# Patient Record
Sex: Female | Born: 1990
Health system: Southern US, Community
[De-identification: ages and names within clinical notes are randomized; demographics above are authoritative.]

---

## 2021-03-13 ENCOUNTER — Encounter (HOSPITAL_COMMUNITY): Payer: Self-pay | Admitting: Emergency Medicine

## 2021-03-13 ENCOUNTER — Emergency Department (HOSPITAL_COMMUNITY): Payer: Self-pay

## 2021-03-13 ENCOUNTER — Other Ambulatory Visit: Payer: Self-pay

## 2021-03-13 ENCOUNTER — Inpatient Hospital Stay (HOSPITAL_COMMUNITY)
Admission: EM | Admit: 2021-03-13 | Discharge: 2021-03-21 | DRG: 872 | Disposition: A | Discharge status: Home / Self Care | Payer: Self-pay | Attending: Internal Medicine | Admitting: Internal Medicine

## 2021-03-13 ENCOUNTER — Inpatient Hospital Stay (HOSPITAL_COMMUNITY): Payer: Self-pay

## 2021-03-13 DIAGNOSIS — R109 Unspecified abdominal pain: Secondary | ICD-10-CM

## 2021-03-13 DIAGNOSIS — F4310 Post-traumatic stress disorder, unspecified: Secondary | ICD-10-CM

## 2021-03-13 DIAGNOSIS — Z9151 Personal history of suicidal behavior: Secondary | ICD-10-CM

## 2021-03-13 DIAGNOSIS — Z62810 Personal history of physical and sexual abuse in childhood: Secondary | ICD-10-CM | POA: Diagnosis present

## 2021-03-13 DIAGNOSIS — E876 Hypokalemia: Secondary | ICD-10-CM | POA: Diagnosis present

## 2021-03-13 DIAGNOSIS — F901 Attention-deficit hyperactivity disorder, predominantly hyperactive type: Secondary | ICD-10-CM

## 2021-03-13 DIAGNOSIS — B1710 Acute hepatitis C without hepatic coma: Secondary | ICD-10-CM | POA: Diagnosis present

## 2021-03-13 DIAGNOSIS — F319 Bipolar disorder, unspecified: Secondary | ICD-10-CM

## 2021-03-13 DIAGNOSIS — E861 Hypovolemia: Secondary | ICD-10-CM | POA: Diagnosis present

## 2021-03-13 DIAGNOSIS — A419 Sepsis, unspecified organism: Principal | ICD-10-CM | POA: Diagnosis present

## 2021-03-13 DIAGNOSIS — Z79899 Other long term (current) drug therapy: Secondary | ICD-10-CM

## 2021-03-13 DIAGNOSIS — R042 Hemoptysis: Secondary | ICD-10-CM | POA: Diagnosis not present

## 2021-03-13 DIAGNOSIS — F4312 Post-traumatic stress disorder, chronic: Secondary | ICD-10-CM | POA: Diagnosis present

## 2021-03-13 DIAGNOSIS — A400 Sepsis due to streptococcus, group A: Principal | ICD-10-CM | POA: Diagnosis present

## 2021-03-13 DIAGNOSIS — Z818 Family history of other mental and behavioral disorders: Secondary | ICD-10-CM

## 2021-03-13 DIAGNOSIS — E871 Hypo-osmolality and hyponatremia: Secondary | ICD-10-CM | POA: Diagnosis present

## 2021-03-13 DIAGNOSIS — Z20822 Contact with and (suspected) exposure to covid-19: Secondary | ICD-10-CM | POA: Diagnosis present

## 2021-03-13 DIAGNOSIS — N179 Acute kidney failure, unspecified: Secondary | ICD-10-CM | POA: Diagnosis present

## 2021-03-13 DIAGNOSIS — Z7251 High risk heterosexual behavior: Secondary | ICD-10-CM

## 2021-03-13 DIAGNOSIS — J189 Pneumonia, unspecified organism: Secondary | ICD-10-CM

## 2021-03-13 DIAGNOSIS — R9389 Abnormal findings on diagnostic imaging of other specified body structures: Secondary | ICD-10-CM

## 2021-03-13 DIAGNOSIS — D509 Iron deficiency anemia, unspecified: Secondary | ICD-10-CM | POA: Diagnosis present

## 2021-03-13 LAB — URINALYSIS, ROUTINE W REFLEX MICROSCOPIC
Bacteria, UA: NONE SEEN
Bilirubin Urine: NEGATIVE
Bilirubin Urine: NEGATIVE
Glucose, UA: NEGATIVE mg/dL
Glucose, UA: NEGATIVE mg/dL
Ketones, ur: 5 mg/dL — AB
Ketones, ur: 20 mg/dL — AB
Leukocytes,Ua: NEGATIVE
Leukocytes,Ua: NEGATIVE
Nitrite: NEGATIVE
Nitrite: NEGATIVE
Protein, ur: NEGATIVE mg/dL
Protein, ur: 30 mg/dL — AB
Specific Gravity, Urine: 1.006 (ref 1.005–1.030)
Specific Gravity, Urine: 1.028 (ref 1.005–1.030)
pH: 6 (ref 5.0–8.0)
pH: 6 (ref 5.0–8.0)

## 2021-03-13 LAB — CBC
HCT: 30.5 % — ABNORMAL LOW (ref 36.0–46.0)
Hemoglobin: 10.4 g/dL — ABNORMAL LOW (ref 12.0–15.0)
MCH: 26.1 pg (ref 26.0–34.0)
MCHC: 34.1 g/dL (ref 30.0–36.0)
MCV: 76.6 fL — ABNORMAL LOW (ref 80.0–100.0)
Platelets: 153 10*3/uL (ref 150–400)
RBC: 3.98 MIL/uL (ref 3.87–5.11)
RDW: 15.7 % — ABNORMAL HIGH (ref 11.5–15.5)
WBC: 15.6 10*3/uL — ABNORMAL HIGH (ref 4.0–10.5)
nRBC: 0 % (ref 0.0–0.2)

## 2021-03-13 LAB — CBC WITH DIFFERENTIAL/PLATELET
Abs Immature Granulocytes: 0.2 10*3/uL — ABNORMAL HIGH (ref 0.00–0.07)
Basophils Absolute: 0 10*3/uL (ref 0.0–0.1)
Basophils Relative: 0 %
Eosinophils Absolute: 0 10*3/uL (ref 0.0–0.5)
Eosinophils Relative: 0 %
HCT: 30.9 % — ABNORMAL LOW (ref 36.0–46.0)
Hemoglobin: 10.2 g/dL — ABNORMAL LOW (ref 12.0–15.0)
Lymphocytes Relative: 2 %
Lymphs Abs: 0.3 10*3/uL — ABNORMAL LOW (ref 0.7–4.0)
MCH: 25.4 pg — ABNORMAL LOW (ref 26.0–34.0)
MCHC: 33 g/dL (ref 30.0–36.0)
MCV: 77.1 fL — ABNORMAL LOW (ref 80.0–100.0)
Metamyelocytes Relative: 1 %
Monocytes Absolute: 0.6 10*3/uL (ref 0.1–1.0)
Monocytes Relative: 4 %
Neutro Abs: 14.4 10*3/uL — ABNORMAL HIGH (ref 1.7–7.7)
Neutrophils Relative %: 93 %
Platelets: 147 10*3/uL — ABNORMAL LOW (ref 150–400)
RBC: 4.01 MIL/uL (ref 3.87–5.11)
RDW: 15.7 % — ABNORMAL HIGH (ref 11.5–15.5)
WBC: 15.5 10*3/uL — ABNORMAL HIGH (ref 4.0–10.5)
nRBC: 0 % (ref 0.0–0.2)
nRBC: 0 /100 WBC

## 2021-03-13 LAB — PROTIME-INR
INR: 1.3 — ABNORMAL HIGH (ref 0.8–1.2)
Prothrombin Time: 15.5 seconds — ABNORMAL HIGH (ref 11.4–15.2)

## 2021-03-13 LAB — COMPREHENSIVE METABOLIC PANEL
ALT: 56 U/L — ABNORMAL HIGH (ref 0–44)
AST: 72 U/L — ABNORMAL HIGH (ref 15–41)
Albumin: 2.8 g/dL — ABNORMAL LOW (ref 3.5–5.0)
Alkaline Phosphatase: 57 U/L (ref 38–126)
Anion gap: 13 (ref 5–15)
BUN: 30 mg/dL — ABNORMAL HIGH (ref 6–20)
CO2: 20 mmol/L — ABNORMAL LOW (ref 22–32)
Calcium: 8.2 mg/dL — ABNORMAL LOW (ref 8.9–10.3)
Chloride: 90 mmol/L — ABNORMAL LOW (ref 98–111)
Creatinine, Ser: 1.61 mg/dL — ABNORMAL HIGH (ref 0.44–1.00)
GFR, Estimated: 44 mL/min — ABNORMAL LOW (ref >60)
Glucose, Bld: 116 mg/dL — ABNORMAL HIGH (ref 70–99)
Potassium: 2.2 mmol/L — CL (ref 3.5–5.1)
Sodium: 123 mmol/L — ABNORMAL LOW (ref 135–145)
Total Bilirubin: 1.4 mg/dL — ABNORMAL HIGH (ref 0.3–1.2)
Total Protein: 6.7 g/dL (ref 6.5–8.1)

## 2021-03-13 LAB — RESP PANEL BY RT-PCR (FLU A&B, COVID) ARPGX2
Influenza A by PCR: NEGATIVE
Influenza B by PCR: NEGATIVE
SARS Coronavirus 2 by RT PCR: NEGATIVE

## 2021-03-13 LAB — BASIC METABOLIC PANEL
Anion gap: 12 (ref 5–15)
BUN: 23 mg/dL — ABNORMAL HIGH (ref 6–20)
CO2: 16 mmol/L — ABNORMAL LOW (ref 22–32)
Calcium: 8 mg/dL — ABNORMAL LOW (ref 8.9–10.3)
Chloride: 101 mmol/L (ref 98–111)
Creatinine, Ser: 1.26 mg/dL — ABNORMAL HIGH (ref 0.44–1.00)
GFR, Estimated: 59 mL/min — ABNORMAL LOW (ref >60)
Glucose, Bld: 80 mg/dL (ref 70–99)
Potassium: 2.7 mmol/L — CL (ref 3.5–5.1)
Sodium: 129 mmol/L — ABNORMAL LOW (ref 135–145)

## 2021-03-13 LAB — MAGNESIUM: Magnesium: 2.3 mg/dL (ref 1.7–2.4)

## 2021-03-13 LAB — APTT: aPTT: 40 seconds — ABNORMAL HIGH (ref 24–36)

## 2021-03-13 LAB — LACTIC ACID, PLASMA
Lactic Acid, Venous: 2.3 mmol/L (ref 0.5–1.9)
Lactic Acid, Venous: 2.2 mmol/L (ref 0.5–1.9)
Lactic Acid, Venous: 2.1 mmol/L (ref 0.5–1.9)

## 2021-03-13 LAB — CK: Total CK: 83 U/L (ref 38–234)

## 2021-03-13 LAB — I-STAT BETA HCG BLOOD, ED (MC, WL, AP ONLY): I-stat hCG, quantitative: 24 m[IU]/mL — ABNORMAL HIGH (ref <5)

## 2021-03-13 LAB — LIPASE, BLOOD: Lipase: 23 U/L (ref 11–51)

## 2021-03-13 LAB — HCG, QUANTITATIVE, PREGNANCY: hCG, Beta Chain, Quant, S: 1 m[IU]/mL (ref <5)

## 2021-03-13 LAB — HIV ANTIBODY (ROUTINE TESTING W REFLEX): HIV Screen 4th Generation wRfx: NONREACTIVE

## 2021-03-13 LAB — TSH: TSH: 0.756 u[IU]/mL (ref 0.350–4.500)

## 2021-03-13 MED ORDER — SODIUM CHLORIDE 0.9 % IV SOLN
2.0000 g | INTRAVENOUS | Status: DC
  Administered 2021-03-14: 2 g via INTRAVENOUS
  Filled 2021-03-13: qty 2

## 2021-03-13 MED ORDER — ACETAMINOPHEN 325 MG PO TABS
650.0000 mg | ORAL_TABLET | Freq: Once | ORAL | Status: AC | PRN
  Administered 2021-03-13: 650 mg via ORAL
  Filled 2021-03-13: qty 2

## 2021-03-13 MED ORDER — MELATONIN 3 MG PO TABS
3.0000 mg | ORAL_TABLET | Freq: Every evening | ORAL | Status: DC | PRN
  Administered 2021-03-13 – 2021-03-16 (×3): 3 mg via ORAL
  Filled 2021-03-13 (×3): qty 1

## 2021-03-13 MED ORDER — LACTATED RINGERS IV SOLN: INTRAVENOUS | Status: DC

## 2021-03-13 MED ORDER — POTASSIUM CHLORIDE 10 MEQ/100ML IV SOLN
10.0000 meq | INTRAVENOUS | Status: AC
  Administered 2021-03-13 (×4): 10 meq via INTRAVENOUS
  Filled 2021-03-13 (×3): qty 100

## 2021-03-13 MED ORDER — POTASSIUM CHLORIDE CRYS ER 20 MEQ PO TBCR
40.0000 meq | EXTENDED_RELEASE_TABLET | Freq: Once | ORAL | Status: AC
  Administered 2021-03-13: 40 meq via ORAL
  Filled 2021-03-13: qty 2

## 2021-03-13 MED ORDER — ACETAMINOPHEN 325 MG PO TABS
650.0000 mg | ORAL_TABLET | Freq: Four times a day (QID) | ORAL | Status: DC | PRN
  Administered 2021-03-13 – 2021-03-14 (×2): 650 mg via ORAL
  Filled 2021-03-13 (×2): qty 2

## 2021-03-13 MED ORDER — ONDANSETRON HCL 4 MG/2ML IJ SOLN
4.0000 mg | Freq: Once | INTRAMUSCULAR | Status: AC
  Administered 2021-03-13: 4 mg via INTRAVENOUS
  Filled 2021-03-13: qty 2

## 2021-03-13 MED ORDER — SODIUM CHLORIDE 0.9 % IV BOLUS
1000.0000 mL | Freq: Once | INTRAVENOUS | Status: AC
  Administered 2021-03-13: 1000 mL via INTRAVENOUS

## 2021-03-13 MED ORDER — SODIUM CHLORIDE 0.9 % IV SOLN
2.0000 g | Freq: Once | INTRAVENOUS | Status: AC
  Administered 2021-03-13: 2 g via INTRAVENOUS
  Filled 2021-03-13: qty 20

## 2021-03-13 MED ORDER — IOHEXOL 300 MG/ML  SOLN
100.0000 mL | Freq: Once | INTRAMUSCULAR | Status: AC | PRN
  Administered 2021-03-13: 100 mL via INTRAVENOUS

## 2021-03-13 MED ORDER — ENOXAPARIN SODIUM 40 MG/0.4ML ~~LOC~~ SOLN
40.0000 mg | SUBCUTANEOUS | Status: DC
  Administered 2021-03-15 – 2021-03-20 (×4): 40 mg via SUBCUTANEOUS
  Filled 2021-03-13 (×8): qty 0.4

## 2021-03-13 MED ORDER — MELATONIN 3 MG PO TABS: 3.0000 mg | ORAL_TABLET | Freq: Every day | ORAL | Status: DC

## 2021-03-13 MED ORDER — HYDROMORPHONE HCL 1 MG/ML IJ SOLN
0.5000 mg | INTRAMUSCULAR | Status: DC | PRN
  Administered 2021-03-13: 0.5 mg via INTRAVENOUS
  Filled 2021-03-13: qty 1

## 2021-03-13 MED ORDER — ENSURE ENLIVE PO LIQD
237.0000 mL | Freq: Two times a day (BID) | ORAL | Status: DC
  Administered 2021-03-14 (×2): 237 mL via ORAL

## 2021-03-13 MED ORDER — METRONIDAZOLE IN NACL 5-0.79 MG/ML-% IV SOLN
500.0000 mg | Freq: Three times a day (TID) | INTRAVENOUS | Status: DC
  Administered 2021-03-13 – 2021-03-14 (×2): 500 mg via INTRAVENOUS
  Filled 2021-03-13 (×2): qty 100

## 2021-03-13 MED ORDER — POTASSIUM CHLORIDE 10 MEQ/100ML IV SOLN
10.0000 meq | INTRAVENOUS | Status: AC
  Administered 2021-03-13 – 2021-03-14 (×6): 10 meq via INTRAVENOUS
  Filled 2021-03-13 (×7): qty 100

## 2021-03-13 MED ORDER — METRONIDAZOLE IN NACL 5-0.79 MG/ML-% IV SOLN
500.0000 mg | Freq: Once | INTRAVENOUS | Status: AC
  Administered 2021-03-13: 500 mg via INTRAVENOUS
  Filled 2021-03-13: qty 100

## 2021-03-13 NOTE — H&P (Signed)
Date: 03/13/2021               Patient Name:  Amy Murray MRN: [id number]  DOB: 22-Jul-1991 Age / Sex: 30 y.o., female   PCP: Patient, No Pcp Per              Medical Service: Internal Medicine Teaching Service              Attending Physician: Dr. Mayford Knife, Dorene Ar, MD    First Contact: Lyda Kalata, MS 3 Pager: [phone]  Second Contact: Dr. Roylene Reason Pager: [phone]  Third Contact Dr. Albertha Ghee Pager: [phone]       After Hours (After 5p/  First Contact Pager: [phone]  weekends / holidays): Second Contact Pager: [phone]   Chief Complaint: Abdominal Pain  History of Present Illness:  Amy Murray is a 30 y.o. woman with a past medical history of bipolar disorder, PTSD, ADHD who presents with abdominal pain. Patient states that she was very cold Friday night after getting her hair done, smoking marijuana with her friend, and eating [employer]. On Saturday morning, she woke up feeling weak and had diffuse abdominal pain that was worse on her right-side and flank. She also began to experience several episodes of "explosive", non-bloody diarrhea, which has been intermittently brown and yellow in color. Notes associated nausea and bilious vomiting, which has been worse when trying to eat or drink. Patient also reports pain with urination and darkened urine. Abdominal pain worsened Monday and patient was evaluated at urgent care. Given Zofran and IVF for nausea at that time without relief. Presented to ED today after developing a fever and productive cough in setting of worsening abdominal pain, nausea, vomiting, and weakness. Cough is now productive of green sputum with streaks of blood. States that a number of her friends and family members had "stomach viruses" with abdominal pain and diarrhea, but their symptoms were milder and occurred several months ago.   Review of Systems: (-): chest pain, shortness of breath, headache, vision changes, vaginal discharge  Past Medical  History:  Bipolar Disorder ADHD PTSD   Home Meds:  None -- recently moved to [facility] and has not found a provider   Allergies: Allergies as of 03/13/2021  . (No Known Allergies)   Surgical History:  None   Family History:  Mother - diabetes, hypertension, bipolar disorder   Social History:  Moved to [place] 1.5 months ago. Previously lived in [place]. Uses marijuana. Drinks alcohol occasionally. Does not use tobacco.   Sexual History: Patient is sexually active with men and women. Uses condoms for contraception when sexually active with men. Does not use contraception when sexually active with women. Has had gonorrhea in the past when she was 19. No other sexually transmitted infections noted.   Physical Exam: Blood pressure 104/67, pulse (!) 120, temperature 99.8 F (37.7 C), temperature source Oral, resp. rate (!) 36, last menstrual period 03/06/2021, SpO2 97 %.   General: Lying in bed, arms by side, moving very sparingly, appears uncomfortable Head: Normocephalic, atraumatic  Eyes: EOM intact, PERRL, no conjunctival pallor, anicteric    Cardiovascular: Regular rate, rhythm; no murmurs, rubs, or gallops; 2+ pulses, all extremities; normal capillary refill    Lungs: Normal pulmonary effort; decreased breath sounds, bilaterally Abdomen: Normal bowel sounds, soft, non-distended; diffuse tenderness to palpation; right flank tenderness; no rebound, guarding   Extremities: No edema, erythema, cyanosis  Skin: Warm, dry  Neuro: Normal attention, mood   Imaging on Admission:   EKG: personally  reviewed my interpretation is sinus tachycardia and prolonged QTc  CXR: personally reviewed my interpretation is pulmonary congestion with no pleural effusion, no lobar pneumonia  DG Chest:  1. Low volume rotated film with pulmonary vascular congestion.  CT Abdomen Pelvis W Contrast:  1. Small foci of gas in the bladder coupled with wall thickening. Haziness in fat between bladder  and uterus. Findings suggest cystitis.  2. Kidneys - normal 3. Small focus of consolidation at the medial right lung base, suggestive of early pneumonia. Aspiration pneumonitis also possible.  4. Appendix - normal.  5. Ovaries appear normal. Uterus grossly normal. Haziness in fat plane between the anterior wall of the uterus and posterior wall of the bladder.   Labs on Admission:  CBC Latest Ref Rng & Units 03/13/2021 03/13/2021  WBC 4.0 - 10.5 K/uL 15.5(H) 15.6(H)  Hemoglobin 12.0 - 15.0 g/dL 10.2(L) 10.4(L)  Hematocrit 36.0 - 46.0 % 30.9(L) 30.5(L)  Platelets 150 - 400 K/uL 147(L) 153  MCV: 77.1 RDW: 15.7 Neutrophils: 93% Neutrophils, absolute: 14.4 Absolute Immature Granulocytes: 0.20  Lymphocytes: 2%  Lymphocytes, absolute: 0.3  CMP Latest Ref Rng & Units 03/13/2021 03/13/2021  Glucose 70 - 99 mg/dL 80 762(G)  BUN 6 - 20 mg/dL 31(D) 17(O)  Creatinine 0.44 - 1.00 mg/dL 1.60(V) 3.71(G)  Sodium 135 - 145 mmol/L 129(L) 123(L)  Potassium 3.5 - 5.1 mmol/L 2.7(LL) 2.2(LL)  Chloride 98 - 111 mmol/L 101 90(L)  CO2 22 - 32 mmol/L 16(L) 20(L)  Calcium 8.9 - 10.3 mg/dL 8.0(L) 8.2(L)  Total Protein 6.5 - 8.1 g/dL - 6.7  Total Bilirubin 0.3 - 1.2 mg/dL - 6.2(I)  Alkaline Phos 38 - 126 U/L - 57  AST 15 - 41 U/L - 72(H)  ALT 0 - 44 U/L - 56(H)  Mg - 2.3  Urinalysis:  Hgb urine dipstick- large  Ketones- 5  Nitrite- negative  Leukocytes- negative  RBC/HPF- 6-10 Bacteria- rare   hCG - 1.0   Lactic acid, plasma - 2.2   Prothrombin time - 15.5  INR - 1.3  APTT - 40  TSH - pending CK - pending    Blood culture  - pending  GC/Chlamydia - pending  Sputum culture - pending  Urine culture - pending  GI panel - pending  C Diff Screen - pending  HIV - pending   Assessment & Plan by Problem: Active Problems:   Sepsis (HCC)  In summary, Amy Murray is a 30 y.o. woman who presents for evaluation of generalized abdominal pain and admitted for sepsis with unknown source.     #Sepsis  #Abdominal Pain, N/V/D ,cough, dysuria Patient presents with signs concerning of a bacterial infection (fever, elevated WBC w/ increased absolute neutrophils) complicated by sepsis (elevated lactic acid, tachycardia, tachypnea). Multiple etiologies are possible given patient is having multiple symptoms, infection work-up pending. Patient presents with signs abdominal infection (diarrhea, diffuse abdominal tenderness, nausea, vomiting). Signs of bacterial pneumonia are also present (productive, rust-colored cough; CT demonstrating consolidation).  It is possible patient had an aspiration event given she has been vomiting for multiple days.  Signs of UTI present (dysuria, microhematuria, CT demonstrating bladder wall thickening) but negative for leukocytes and nitrites.  PID also possible in setting of CT demonstrating haziness at uterus-bladder border and sexual activity without contraception. Normal HCG rules out ectopic pregnancy. No signs of pyelonephritis. No signs of ovarian abscess on imaging.  Patient has been started on broad-spectrum antibiotic coverage with ceftriaxone and metronidazole for anaerobic coverage.  We will  follow up culture data and adjust antibiotic regimen.   Plan:  - Treat empirically for GI infection (metronidazole IV 500 mg Q8H) - Treat empirically for pneumonia (ceftriaxone IV 2g daily)  - F/u GI panel, sputum culture, urine culture, blood culture, GC/Chlamydia test,  - F/u repeat plasma lactic acid  - IVF LR 200 cc/hr   - F/u CBC tomorrow  - IV Zofran 4 mg PRN for nausea and vomiting  #Hypokalemia  On admission, serum potassium was decreased at 2.2. Improved to 2.7. Likely secondary to multiple episodes of vomiting and diarrhea. This is possibly contributing to patients diffuse weakness.  Plan: - Replete with IV KCl 10 mEq for 6 doses   - F/u CMP to monitor electrolytes   #Hypovolemic Hyponatremia  Asymptomatic mild hypovolemic hyponatremia which is  improving with fluids.  Not on any medications which would cause this.  This is in setting of several episodes of vomiting, diarrhea. On admission, serum sodium was 123. Partially repleted to 129 with IVF NS in ED.  Plan:  - Replete with IVF at a rate of 4-6 mEq/hr to avoid osmotic demyelination syndrome  - F/u CMP to monitor electrolytes  #AKI? Patient comes in with a creatinine of 1.61.  We do not have labs prior to suggest that this is a acute kidney injury or CKD stage III.  I expect a prerenal AKI given presenting symptoms and BUN to creatinine ratio approximately 20.  - Trend Renal function with fluids  #Microcytic anemia Patient's hemoglobin 10.2, no active bleeding.  Her last menstrual cycle was a few weeks ago. -holding off on checking iron studies during acute infection as we would not treat IDA -Trend hemoglobin on CBC.  #Elevated i-STAT hCG -Followed up with serum hCG which was less than 1, not pregnant  Diet:Regular VTE ppx: Enoxaparin  Code status:Full  Dispo: Admit patient to Inpatient with expected length of stay greater than 2 midnights.  Signed: Lyda Kalata, Medical Student 03/13/2021, 4:30 PM   Attestation for Student Documentation:  I personally was present and performed or re-performed the history, physical exam and medical decision-making activities of this service and have verified that the service and findings are accurately documented in the student's note.  Albertha Ghee, MD 03/13/2021, 6:47 PM

## 2021-03-13 NOTE — ED Notes (Signed)
Got patient into a gown on the monitor patient is resting with call bell in reach 

## 2021-03-13 NOTE — ED Notes (Signed)
IV access attempted without success.

## 2021-03-13 NOTE — ED Provider Notes (Signed)
[facility] EMERGENCY DEPARTMENT Provider Note   CSN: [id number] Arrival date & time: 03/13/21  4128     History Chief Complaint  Patient presents with  . Abdominal Pain    Amy Murray is a 30 y.o. female.  30 year old female with no significant past medical history presents with complaint of nausea, vomiting, diarrhea, abdominal pain and fever.  Patient states her symptoms started about 4 days ago, thought to be related to eating contaminated food (pizza), went to urgent care yesterday and was given IV fluids and Zofran.  Patient started running a fever yesterday and arrives emergency room with a temp of 102.9.  Last took Tylenol last night.  No known sick contacts, describes emesis and stools as nonbloody, not greasy, not bilious.  No prior abdominal surgeries.  Pain is located generalized abdomen, described as "a beat box."  Also reports generalized body aches and joint pains which she relates to sitting on her knees and vomiting frequently.  Also reports dysuria, states she was told in urgent care that she had blood in her urine however she has not seen blood in the urine.        History reviewed. No pertinent past medical history.  Patient Active Problem List   Diagnosis Date Noted  . Sepsis (HCC) 03/13/2021    History reviewed. No pertinent surgical history.   OB History   No obstetric history on file.     No family history on file.  Social History   Tobacco Use  . Smoking status: Never Smoker  . Smokeless tobacco: Never Used  Substance Use Topics  . Alcohol use: Not Currently  . Drug use: Not Currently    Home Medications Prior to Admission medications   Not on File    Allergies    Patient has no known allergies.  Review of Systems   Review of Systems  Constitutional: Positive for chills and fever.  HENT: Negative for congestion.   Respiratory: Negative for shortness of breath.   Cardiovascular: Negative for chest pain.   Gastrointestinal: Positive for abdominal pain, diarrhea, nausea and vomiting. Negative for blood in stool and constipation.  Genitourinary: Positive for dysuria. Negative for hematuria.  Musculoskeletal: Positive for arthralgias and myalgias. Negative for back pain.  Skin: Negative for rash and wound.  Allergic/Immunologic: Negative for immunocompromised state.  Neurological: Negative for weakness.  Hematological: Negative for adenopathy.  Psychiatric/Behavioral: Negative for confusion.  All other systems reviewed and are negative.   Physical Exam Updated Vital Signs BP 126/75   Pulse (!) 126   Temp 99.8 F (37.7 C) (Oral)   Resp (!) 36   LMP 03/06/2021   SpO2 97%   Physical Exam Vitals and nursing note reviewed.  Constitutional:      General: She is not in acute distress.    Appearance: She is well-developed. She is not diaphoretic.  HENT:     Head: Normocephalic and atraumatic.  Cardiovascular:     Rate and Rhythm: Regular rhythm. Tachycardia present.     Heart sounds: Normal heart sounds.  Pulmonary:     Effort: Pulmonary effort is normal.     Breath sounds: Normal breath sounds.  Abdominal:     Palpations: Abdomen is soft.     Tenderness: There is generalized abdominal tenderness. There is no right CVA tenderness, left CVA tenderness, guarding or rebound.  Skin:    General: Skin is warm and dry.     Findings: No erythema or rash.  Neurological:  Mental Status: She is alert and oriented to person, place, and time.  Psychiatric:        Behavior: Behavior normal.     ED Results / Procedures / Treatments   Labs (all labs ordered are listed, but only abnormal results are displayed) Labs Reviewed  COMPREHENSIVE METABOLIC PANEL - Abnormal; Notable for the following components:      Result Value   Sodium 123 (*)    Potassium 2.2 (*)    Chloride 90 (*)    CO2 20 (*)    Glucose, Bld 116 (*)    BUN 30 (*)    Creatinine, Ser 1.61 (*)    Calcium 8.2 (*)     Albumin 2.8 (*)    AST 72 (*)    ALT 56 (*)    Total Bilirubin 1.4 (*)    GFR, Estimated 44 (*)    All other components within normal limits  CBC - Abnormal; Notable for the following components:   WBC 15.6 (*)    Hemoglobin 10.4 (*)    HCT 30.5 (*)    MCV 76.6 (*)    RDW 15.7 (*)    All other components within normal limits  LACTIC ACID, PLASMA - Abnormal; Notable for the following components:   Lactic Acid, Venous 2.3 (*)    All other components within normal limits  LACTIC ACID, PLASMA - Abnormal; Notable for the following components:   Lactic Acid, Venous 2.2 (*)    All other components within normal limits  CBC WITH DIFFERENTIAL/PLATELET - Abnormal; Notable for the following components:   WBC 15.5 (*)    Hemoglobin 10.2 (*)    HCT 30.9 (*)    MCV 77.1 (*)    MCH 25.4 (*)    RDW 15.7 (*)    Platelets 147 (*)    Neutro Abs 14.4 (*)    Lymphs Abs 0.3 (*)    Abs Immature Granulocytes 0.20 (*)    All other components within normal limits  URINALYSIS, ROUTINE W REFLEX MICROSCOPIC - Abnormal; Notable for the following components:   Color, Urine STRAW (*)    Hgb urine dipstick LARGE (*)    Ketones, ur 5 (*)    Bacteria, UA RARE (*)    All other components within normal limits  I-STAT BETA HCG BLOOD, ED ([facility], [facility], [facility] ONLY) - Abnormal; Notable for the following components:   I-stat hCG, quantitative 24.0 (*)    All other components within normal limits  CULTURE, BLOOD (ROUTINE X 2)  CULTURE, BLOOD (ROUTINE X 2)  CULTURE, BLOOD (SINGLE)  URINE CULTURE  GASTROINTESTINAL PANEL BY PCR, STOOL (REPLACES STOOL CULTURE)  C DIFFICILE QUICK SCREEN W PCR REFLEX  RESP PANEL BY RT-PCR (FLU A&B, COVID) ARPGX2  EXPECTORATED SPUTUM ASSESSMENT W GRAM STAIN, RFLX TO RESP C  LIPASE, BLOOD  PROTIME-INR  APTT  MAGNESIUM  HIV ANTIBODY (ROUTINE TESTING W REFLEX)  TSH  BASIC METABOLIC PANEL  HCG, QUANTITATIVE, PREGNANCY  LACTIC ACID, PLASMA  LACTIC ACID, PLASMA  CYTOLOGY - NON PAP     EKG EKG Interpretation  Date/Time:  Tuesday March 13 2021 09:54:03 EDT Ventricular Rate:  135 PR Interval:    QRS Duration: 102 QT Interval:  410 QTC Calculation: 615 R Axis:   74 Text Interpretation: Sinus tachycardia Prolonged QT interval No old tracing to compare Confirmed by Lorre Nick ([id number]) on 03/13/2021 1:04:56 PM   Radiology DG Chest 1 View  Result Date: 03/13/2021 CLINICAL DATA:  Sepsis EXAM: CHEST  1 VIEW COMPARISON:  None. FINDINGS: 1309 hours. Low volume rotated film. Cardiopericardial silhouette is at upper limits of normal for size. There is pulmonary vascular congestion without overt pulmonary edema. No pleural effusion. The visualized bony structures of the thorax show no acute abnormality. Telemetry leads overlie the chest. IMPRESSION: Low volume rotated film with pulmonary vascular congestion. Electronically Signed   By: Kennith CenterEric  Mansell M.D.   On: 03/13/2021 13:35   CT Abdomen Pelvis W Contrast  Result Date: 03/13/2021 CLINICAL DATA:  Abdominal pain and fever.  Nausea and vomiting. EXAM: CT ABDOMEN AND PELVIS WITH CONTRAST TECHNIQUE: Multidetector CT imaging of the abdomen and pelvis was performed using the standard protocol following bolus administration of intravenous contrast. CONTRAST:  100mL OMNIPAQUE IOHEXOL 300 MG/ML  SOLN COMPARISON:  None. FINDINGS: Lower chest: Small focus of consolidation in the medial RIGHT lung base measures 6 cm by 2.9 cm (image 9/series 4) mild atelectasis at the LEFT lung base. Hepatobiliary: No focal hepatic lesion. No biliary duct dilatation. Common bile duct is normal. Pancreas: Pancreas is normal. No ductal dilatation. No pancreatic inflammation. Spleen: Normal spleen Adrenals/urinary tract: Adrenal glands normal. Kidneys enhance symmetrically. No hydronephrosis. Ureters appear normal. There are 2 foci of gas within the bladder which collect along the non dependent wall the bladder. There is haziness in the retroperitoneal fat between  the bladder and uterus (image 72/3). There is thickening of the bladder wall seen on coronal projection 58/6 measuring 5 mm. Stomach/Bowel: Stomach, small-bowel and cecum normal. Appendix is normal. Ascending, transverse and descending colon normal. Rectosigmoid colon normal. Vascular/Lymphatic: Abdominal aorta is normal caliber. No periportal or retroperitoneal adenopathy. No pelvic adenopathy. Reproductive: Uterus is grossly normal. Again indistinct hazy fat between the uterus and the posterior wall the bladder. Ovaries are grossly normal. Other: Small amount free fluid in the posterior cul-de-sac simple fluid attenuation. No abscess in the abdomen pelvis. Musculoskeletal: No aggressive osseous lesion. IMPRESSION: 1. A small foci of gas within the bladder coupled with bladder wall thickening and haziness in the fat between the bladder and the uterus. Findings suggest cystitis. Recommend correlation with instrumentation of the bladder. 2. kidneys appear normal out evidence obstruction or infection. 3. Small focus of consolidation at the medial RIGHT lung base suggests early pneumonia. Aspiration pneumonitis could have similar pattern. 4. Appendix is normal. 5. Ovaries appear normal. Uterus grossly normal other than haziness in the fat plane between the anterior wall of the uterus and posterior wall of the bladder. Findings conveyed toLAURA MURPHY on 03/13/2021  at13:18. Electronically Signed   By: Genevive BiStewart  Edmunds M.D.   On: 03/13/2021 13:19    Procedures .Critical Care Performed by: Jeannie FendMurphy, Laura A, PA-C Authorized by: Jeannie FendMurphy, Laura A, PA-C   Critical care provider statement:    Critical care time (minutes):  45   Critical care was time spent personally by me on the following activities:  Discussions with consultants, evaluation of patient's response to treatment, examination of patient, ordering and performing treatments and interventions, ordering and review of laboratory studies, ordering and review of  radiographic studies, pulse oximetry, re-evaluation of patient's condition, obtaining history from patient or surrogate and review of old charts     Medications Ordered in ED Medications  HYDROmorphone (DILAUDID) injection 0.5 mg (0.5 mg Intravenous Given 03/13/21 1208)  cefTRIAXone (ROCEPHIN) 2 g in sodium chloride 0.9 % 100 mL IVPB (2 g Intravenous New Bag/Given 03/13/21 1457)    And  metroNIDAZOLE (FLAGYL) IVPB 500 mg (500 mg Intravenous New Bag/Given 03/13/21  1457)  potassium chloride 10 mEq in 100 mL IVPB (10 mEq Intravenous New Bag/Given 03/13/21 1503)  enoxaparin (LOVENOX) injection 40 mg (has no administration in time range)  acetaminophen (TYLENOL) tablet 650 mg (650 mg Oral Given 03/13/21 1208)  sodium chloride 0.9 % bolus 1,000 mL (0 mLs Intravenous Stopped 03/13/21 1354)  ondansetron (ZOFRAN) injection 4 mg (4 mg Intravenous Given 03/13/21 1208)  sodium chloride 0.9 % bolus 1,000 mL (0 mLs Intravenous Stopped 03/13/21 1355)  potassium chloride SA (KLOR-CON) CR tablet 40 mEq (40 mEq Oral Given 03/13/21 1255)  iohexol (OMNIPAQUE) 300 MG/ML solution 100 mL (100 mLs Intravenous Contrast Given 03/13/21 1249)    ED Course  I have reviewed the triage vital signs and the nursing notes.  Pertinent labs & imaging results that were available during my care of the patient were reviewed by me and considered in my medical decision making (see chart for details).  Clinical Course as of 03/13/21 1516  Tue Mar 13, 2021  5024 30 year old female with complaint of abdominal pain with fevers, nausea, vomiting, diarrhea.  On exam, has mild generalized abdominal pain without guarding or rebound tenderness.  Patient's heart rate is elevated at 130 with a temperature of 102.9. Labs ordered including lactic acid, IV pain medication and fluids. hCG returns at 24.  Discussed with patient to denies possibility of pregnancy, states that she is a lesbian and does not have sex with men, states that she could not  possibly be pregnant.  Discussed with patient the concerns for risk for radiation in early pregnancy, patient verbalizes understanding and again states that she could not be pregnant. [LM]  1326 Labs return with elevated lactic acid at 2.3, given IV fluids, repeat is 2.2. Patient was given Rocephin and Flagyl for suspected abdominal infection. CBC with leukocytosis at 15.5 with bandemia. CMP with mildly elevated LFTs, elevated Cr to 1.61, hypokalemia with potassium of 2.2 (given oral and IV potassium), magnesium added on.  Also found with hyponatremia with sodium of 123. UA with large hgb, negative for leukocytes and nitrites.  Add on GI biofire and c diff testing for significant diarrhea.  Radiology called to discuss CT abdomen pelvis, no specific findings to explain patient's symptoms today, does have bladder wall thickening, stranding between the bladder and the uterus which is nonspecific.  Also found to have right lower lobe pneumonia.  Discussed with radiology, add on pelvic ultrasound to further evaluate the easiness between the bladder and the uterus.  Case discussed with hospitalist service who will consult for admission. [LM]    Clinical Course User Index [LM] Alden Hipp   MDM Rules/Calculators/A&P                          Final Clinical Impression(s) / ED Diagnoses Final diagnoses:  Sepsis, due to unspecified organism, unspecified whether acute organ dysfunction present [facility])  Community acquired pneumonia of right lower lobe of lung  Hyponatremia  Hypokalemia    Rx / DC Orders ED Discharge Orders    None       Alden Hipp 03/13/21 1517    Lorre Nick, MD 03/14/21 1517

## 2021-03-13 NOTE — ED Notes (Signed)
Patient transported to CT 

## 2021-03-13 NOTE — ED Provider Notes (Signed)
I provided a substantive portion of the care of this patient.  I personally performed the entirety of the medical decision making for this encounter.  EKG Interpretation  Date/Time:  Tuesday March 13 2021 09:54:03 EDT Ventricular Rate:  135 PR Interval:    QRS Duration: 102 QT Interval:  410 QTC Calculation: 615 R Axis:   74 Text Interpretation: Sinus tachycardia Prolonged QT interval No old tracing to compare Confirmed by Lorre Nick ([id number]) on 03/13/2021 1:22:21 PM  30 year old female presents with abdominal discomfort with associated nausea vomiting diarrhea.  Has evidence of hyponatremia along with hypokalemia.  Abdominal CT without definitive acute process with exception of likely pneumonia on the right side. Code sepsis initiated and patient given IV antibiotics.  Patient to require hospital admission   Lorre Nick, MD 03/13/21 1327

## 2021-03-13 NOTE — ED Triage Notes (Addendum)
C/o fever, R sided abd pain, nausea, vomiting, and diarrhea since Saturday.  Seen at [facility] yesterday.  Attempted to get labs at triage and pt on cell phone talking to someone about getting a password.  Multiple attempts made to draw blood and pt refusing at this time.  Pt states she isn't taking her "psych meds" any longer.  Denies PMH though.

## 2021-03-14 DIAGNOSIS — R197 Diarrhea, unspecified: Secondary | ICD-10-CM

## 2021-03-14 DIAGNOSIS — R7989 Other specified abnormal findings of blood chemistry: Secondary | ICD-10-CM

## 2021-03-14 DIAGNOSIS — R1084 Generalized abdominal pain: Secondary | ICD-10-CM

## 2021-03-14 DIAGNOSIS — E876 Hypokalemia: Secondary | ICD-10-CM

## 2021-03-14 DIAGNOSIS — E871 Hypo-osmolality and hyponatremia: Secondary | ICD-10-CM

## 2021-03-14 DIAGNOSIS — E861 Hypovolemia: Secondary | ICD-10-CM

## 2021-03-14 DIAGNOSIS — D509 Iron deficiency anemia, unspecified: Secondary | ICD-10-CM

## 2021-03-14 DIAGNOSIS — N179 Acute kidney failure, unspecified: Secondary | ICD-10-CM

## 2021-03-14 LAB — DIC (DISSEMINATED INTRAVASCULAR COAGULATION)PANEL
D-Dimer, Quant: 7.69 ug/mL-FEU — ABNORMAL HIGH (ref 0.00–0.50)
Fibrinogen: 654 mg/dL — ABNORMAL HIGH (ref 210–475)
INR: 1.3 — ABNORMAL HIGH (ref 0.8–1.2)
Platelets: 93 10*3/uL — ABNORMAL LOW (ref 150–400)
Prothrombin Time: 15.6 seconds — ABNORMAL HIGH (ref 11.4–15.2)
Smear Review: NONE SEEN
aPTT: 25 seconds (ref 24–36)

## 2021-03-14 LAB — CBC WITH DIFFERENTIAL/PLATELET
Abs Immature Granulocytes: 0.38 10*3/uL — ABNORMAL HIGH (ref 0.00–0.07)
Basophils Absolute: 0.1 10*3/uL (ref 0.0–0.1)
Basophils Relative: 1 %
Eosinophils Absolute: 0 10*3/uL (ref 0.0–0.5)
Eosinophils Relative: 0 %
HCT: 25.5 % — ABNORMAL LOW (ref 36.0–46.0)
Hemoglobin: 9.2 g/dL — ABNORMAL LOW (ref 12.0–15.0)
Immature Granulocytes: 3 %
Lymphocytes Relative: 3 %
Lymphs Abs: 0.5 10*3/uL — ABNORMAL LOW (ref 0.7–4.0)
MCH: 26.8 pg (ref 26.0–34.0)
MCHC: 36.1 g/dL — ABNORMAL HIGH (ref 30.0–36.0)
MCV: 74.3 fL — ABNORMAL LOW (ref 80.0–100.0)
Monocytes Absolute: 1.4 10*3/uL — ABNORMAL HIGH (ref 0.1–1.0)
Monocytes Relative: 9 %
Neutro Abs: 13 10*3/uL — ABNORMAL HIGH (ref 1.7–7.7)
Neutrophils Relative %: 84 %
Platelets: 109 10*3/uL — ABNORMAL LOW (ref 150–400)
RBC: 3.43 MIL/uL — ABNORMAL LOW (ref 3.87–5.11)
RDW: 15.9 % — ABNORMAL HIGH (ref 11.5–15.5)
WBC: 15.3 10*3/uL — ABNORMAL HIGH (ref 4.0–10.5)
nRBC: 0 % (ref 0.0–0.2)

## 2021-03-14 LAB — BLOOD CULTURE ID PANEL (REFLEXED) - BCID2

## 2021-03-14 LAB — COMPREHENSIVE METABOLIC PANEL
ALT: 43 U/L (ref 0–44)
ALT: 45 U/L — ABNORMAL HIGH (ref 0–44)
ALT: 40 U/L (ref 0–44)
AST: 58 U/L — ABNORMAL HIGH (ref 15–41)
AST: 60 U/L — ABNORMAL HIGH (ref 15–41)
AST: 53 U/L — ABNORMAL HIGH (ref 15–41)
Albumin: 2.1 g/dL — ABNORMAL LOW (ref 3.5–5.0)
Albumin: 2.1 g/dL — ABNORMAL LOW (ref 3.5–5.0)
Albumin: 2 g/dL — ABNORMAL LOW (ref 3.5–5.0)
Alkaline Phosphatase: 48 U/L (ref 38–126)
Alkaline Phosphatase: 49 U/L (ref 38–126)
Alkaline Phosphatase: 65 U/L (ref 38–126)
Anion gap: 9 (ref 5–15)
Anion gap: 9 (ref 5–15)
Anion gap: 14 (ref 5–15)
BUN: 14 mg/dL (ref 6–20)
BUN: 14 mg/dL (ref 6–20)
BUN: 11 mg/dL (ref 6–20)
CO2: 17 mmol/L — ABNORMAL LOW (ref 22–32)
CO2: 17 mmol/L — ABNORMAL LOW (ref 22–32)
CO2: 17 mmol/L — ABNORMAL LOW (ref 22–32)
Calcium: 7.4 mg/dL — ABNORMAL LOW (ref 8.9–10.3)
Calcium: 7.6 mg/dL — ABNORMAL LOW (ref 8.9–10.3)
Calcium: 8 mg/dL — ABNORMAL LOW (ref 8.9–10.3)
Chloride: 102 mmol/L (ref 98–111)
Chloride: 99 mmol/L (ref 98–111)
Chloride: 101 mmol/L (ref 98–111)
Creatinine, Ser: 1.03 mg/dL — ABNORMAL HIGH (ref 0.44–1.00)
Creatinine, Ser: 1.05 mg/dL — ABNORMAL HIGH (ref 0.44–1.00)
Creatinine, Ser: 0.9 mg/dL (ref 0.44–1.00)
GFR, Estimated: >60 mL/min (ref >60)
GFR, Estimated: >60 mL/min (ref >60)
GFR, Estimated: >60 mL/min (ref >60)
Glucose, Bld: 110 mg/dL — ABNORMAL HIGH (ref 70–99)
Glucose, Bld: 113 mg/dL — ABNORMAL HIGH (ref 70–99)
Glucose, Bld: 73 mg/dL (ref 70–99)
Potassium: 2.9 mmol/L — ABNORMAL LOW (ref 3.5–5.1)
Potassium: 2.9 mmol/L — ABNORMAL LOW (ref 3.5–5.1)
Potassium: 3.2 mmol/L — ABNORMAL LOW (ref 3.5–5.1)
Sodium: 125 mmol/L — ABNORMAL LOW (ref 135–145)
Sodium: 128 mmol/L — ABNORMAL LOW (ref 135–145)
Sodium: 132 mmol/L — ABNORMAL LOW (ref 135–145)
Total Bilirubin: 1.2 mg/dL (ref 0.3–1.2)
Total Bilirubin: 1.3 mg/dL — ABNORMAL HIGH (ref 0.3–1.2)
Total Bilirubin: 2.4 mg/dL — ABNORMAL HIGH (ref 0.3–1.2)
Total Protein: 5.3 g/dL — ABNORMAL LOW (ref 6.5–8.1)
Total Protein: 5.4 g/dL — ABNORMAL LOW (ref 6.5–8.1)
Total Protein: 5.5 g/dL — ABNORMAL LOW (ref 6.5–8.1)

## 2021-03-14 LAB — TECHNOLOGIST SMEAR REVIEW: Plt Morphology: DECREASED

## 2021-03-14 LAB — GASTROINTESTINAL PANEL BY PCR, STOOL (REPLACES STOOL CULTURE)

## 2021-03-14 LAB — HEPATITIS B SURFACE ANTIBODY,QUALITATIVE: Hep B S Ab: NONREACTIVE

## 2021-03-14 LAB — CBC
HCT: 25.5 % — ABNORMAL LOW (ref 36.0–46.0)
Hemoglobin: 9.1 g/dL — ABNORMAL LOW (ref 12.0–15.0)
MCH: 25.9 pg — ABNORMAL LOW (ref 26.0–34.0)
MCHC: 35.7 g/dL (ref 30.0–36.0)
MCV: 72.6 fL — ABNORMAL LOW (ref 80.0–100.0)
Platelets: 96 10*3/uL — ABNORMAL LOW (ref 150–400)
RBC: 3.51 MIL/uL — ABNORMAL LOW (ref 3.87–5.11)
RDW: 16 % — ABNORMAL HIGH (ref 11.5–15.5)
WBC: 17.4 10*3/uL — ABNORMAL HIGH (ref 4.0–10.5)
nRBC: 0 % (ref 0.0–0.2)

## 2021-03-14 LAB — C DIFFICILE QUICK SCREEN W PCR REFLEX
C Diff antigen: NEGATIVE
C Diff interpretation: NOT DETECTED
C Diff toxin: NEGATIVE

## 2021-03-14 LAB — EXPECTORATED SPUTUM ASSESSMENT W GRAM STAIN, RFLX TO RESP C

## 2021-03-14 LAB — HEPATITIS B CORE ANTIBODY, TOTAL: Hep B Core Total Ab: NONREACTIVE

## 2021-03-14 LAB — HEPATITIS B SURFACE ANTIGEN: Hepatitis B Surface Ag: NONREACTIVE

## 2021-03-14 LAB — SAVE SMEAR(SSMR), FOR PROVIDER SLIDE REVIEW

## 2021-03-14 LAB — LACTIC ACID, PLASMA: Lactic Acid, Venous: 1.5 mmol/L (ref 0.5–1.9)

## 2021-03-14 LAB — D-DIMER, QUANTITATIVE: D-Dimer, Quant: 5.45 ug/mL-FEU — ABNORMAL HIGH (ref 0.00–0.50)

## 2021-03-14 MED ORDER — PENICILLIN G POTASSIUM 20000000 UNITS IJ SOLR: 4.0000 10*6.[IU] | INTRAVENOUS | Status: DC

## 2021-03-14 MED ORDER — LACTATED RINGERS IV SOLN: INTRAVENOUS | Status: DC

## 2021-03-14 MED ORDER — POTASSIUM CHLORIDE CRYS ER 20 MEQ PO TBCR
40.0000 meq | EXTENDED_RELEASE_TABLET | Freq: Two times a day (BID) | ORAL | Status: DC
  Administered 2021-03-14: 40 meq via ORAL
  Filled 2021-03-14: qty 2

## 2021-03-14 MED ORDER — ONDANSETRON HCL 4 MG PO TABS: 4.0000 mg | ORAL_TABLET | Freq: Four times a day (QID) | ORAL | Status: DC | PRN

## 2021-03-14 MED ORDER — PENICILLIN G POTASSIUM 20000000 UNITS IJ SOLR
12.0000 10*6.[IU] | Freq: Two times a day (BID) | INTRAVENOUS | Status: DC
  Administered 2021-03-14 – 2021-03-15 (×2): 12 10*6.[IU] via INTRAVENOUS
  Filled 2021-03-14 (×4): qty 12

## 2021-03-14 MED ORDER — ACETAMINOPHEN 325 MG PO TABS
650.0000 mg | ORAL_TABLET | Freq: Four times a day (QID) | ORAL | Status: DC
  Administered 2021-03-14 – 2021-03-16 (×8): 650 mg via ORAL
  Filled 2021-03-14 (×8): qty 2

## 2021-03-14 MED ORDER — POTASSIUM CHLORIDE 10 MEQ/100ML IV SOLN
10.0000 meq | INTRAVENOUS | Status: AC
  Administered 2021-03-14 (×3): 10 meq via INTRAVENOUS
  Filled 2021-03-14 (×3): qty 100

## 2021-03-14 MED ORDER — ONDANSETRON HCL 4 MG/2ML IJ SOLN: 4.0000 mg | Freq: Four times a day (QID) | INTRAMUSCULAR | Status: DC | PRN

## 2021-03-14 MED ORDER — POTASSIUM CHLORIDE CRYS ER 20 MEQ PO TBCR
40.0000 meq | EXTENDED_RELEASE_TABLET | Freq: Once | ORAL | Status: AC
  Administered 2021-03-14: 40 meq via ORAL
  Filled 2021-03-14: qty 2

## 2021-03-14 MED ORDER — ENSURE ENLIVE PO LIQD
237.0000 mL | Freq: Three times a day (TID) | ORAL | Status: DC
  Administered 2021-03-14 – 2021-03-21 (×8): 237 mL via ORAL
  Filled 2021-03-14 (×2): qty 237

## 2021-03-14 MED ORDER — ADULT MULTIVITAMIN W/MINERALS CH
1.0000 | ORAL_TABLET | Freq: Every day | ORAL | Status: DC
  Administered 2021-03-14 – 2021-03-21 (×8): 1 via ORAL
  Filled 2021-03-14 (×9): qty 1

## 2021-03-14 MED ORDER — POTASSIUM CHLORIDE 10 MEQ/100ML IV SOLN
10.0000 meq | INTRAVENOUS | Status: DC
  Administered 2021-03-14 (×2): 10 meq via INTRAVENOUS
  Filled 2021-03-14 (×4): qty 100

## 2021-03-14 NOTE — Progress Notes (Signed)
Called and ask Amy Murray in pharm to reschedule last dose of potassium that was not given at 1715.

## 2021-03-14 NOTE — Progress Notes (Signed)
Initial Nutrition Assessment  DOCUMENTATION CODES:   Not applicable  INTERVENTION:  Obtain weight  Ensure Enlive po TID, each supplement provides 350 kcal and 20 grams of protein  MVI with minerals daily  NUTRITION DIAGNOSIS:   Inadequate oral intake related to nausea,vomiting as evidenced by per patient/family report.    GOAL:   Patient will meet greater than or equal to 90% of their needs    MONITOR:   PO intake,Supplement acceptance,Weight trends,Labs,I & O's  REASON FOR ASSESSMENT:   Malnutrition Screening Tool    ASSESSMENT:   Pt admitted with abdominal pain and sepsis 2/2 bacteremia w/ S. Pyogenes. PMH includes bipolar disorder, PTSD, ADHD   Pt unavailable at time of RD visit. Per H&P, pt reported having abdominal pain and intermittent N/V/D that began Saturday (3/12). Pt stated that eating/drinking exacerbates nausea. Per RN, pt endorsing slightly improved appetite today. Pt receiving Ensure BID and is consuming well per RN.   No weight obtained for this admission and no weight history available in weight readings. Per Care Everywhere, pt weighed 68 kg (150 lbs) on 03/12/21. Will use this to estimate needs until new weight is obtained.   No UOP documented.   Medications reviewed. Labs: Na 128 (L), K+ 2.9 (L), Cr 1.03 (H, trending down)  Diet Order:   Diet Order            Diet regular Room service appropriate? Yes; Fluid consistency: Thin  Diet effective now                 EDUCATION NEEDS:   No education needs have been identified at this time  Skin:  Skin Assessment: Reviewed RN Assessment  Last BM:  3/15  Height:   Ht Readings from Last 1 Encounters:  No data found for Ht    Weight:   Wt Readings from Last 1 Encounters:  No data found for Wt   BMI:  There is no height or weight on file to calculate BMI.  Estimated Nutritional Needs:   Kcal:  1700-1900  Protein:  85-95 grams  Fluid:  >1.7L/d    Eugene Gavia, MS, RD,  LDN RD pager number and weekend/on-call pager number located in Amion.

## 2021-03-14 NOTE — Plan of Care (Signed)
  Problem: Clinical Measurements: Goal: Respiratory complications will improve Outcome: Progressing   Problem: Safety: Goal: Ability to remain free from injury will improve Outcome: Progressing   Problem: Clinical Measurements: Goal: Cardiovascular complication will be avoided Outcome: Not Progressing  ST Problem: Elimination: Goal: Will not experience complications related to bowel motility Outcome: Not Progressing  Loose stool

## 2021-03-14 NOTE — Progress Notes (Addendum)
Subjective: Amy Murray is a 30 y.o. woman with a past medical history of bipolar disorder, ADHD, and PTSD who presents with generalized abdominal pain and admitted for sepsis.   No acute overnight events.   This morning, patient reports feeling better with reduced abdominal pain, nausea, and vomiting. Still has watery diarrhea but this has become more firm in consistency. Would like to wear diapers for comfort given frequent diarrhea. Patient now has an appetite and looking forward to eating a fruit salad. Notes pain of the right wrist near site of recent IV administration, which was performed during urgent care encounter on Monday. Reports increased swelling of right wrist and forearm since admission. States that both shoulders are also painful with pain most pronounced at left shoulder.    ROS:  (+): cough  (-): dysuria   Objective: Vital signs in last 24 hours: Vitals:   03/13/21 2058 03/14/21 0118 03/14/21 0858 03/14/21 1100  BP:  108/67 111/66 108/70  Pulse: (!) 126 (!) 120 (!) 102 100  Resp: (!) 28 20 (!) 22 20  Temp:  99.9 F (37.7 C) (!) 100.8 F (38.2 C) 98.7 F (37.1 C)  TempSrc:  Oral Oral   SpO2: 97% 95% 96% 98%   Physical Exam:  General: Thin, young woman; appears mildly uncomfortable due to pain; moves upper extremities very sparingly Cardiovascular: Tachycardic; regular rhythm; no murmurs, rubs, gallops Lungs: Increased work of breathing; decreased breath sounds, right lung base; no rales/crackles  Abdomen: Normal bowel sounds; soft, non-distended; tender to palpation, RUQ Extremities: Tender to palpation, bilateral upper extremities (most pronounced at left shoulder, right wrist, and right elbow); Limited ROM due to pain, bilateral upper extremities; no edema, bilateral lower extremities   Skin: Increased warmth to touch, diffusely; dry    Labs (since admission):  CBC Latest Ref Rng & Units 03/14/2021 03/14/2021 03/13/2021  WBC 4.0 - 10.5 K/uL - 15.3(H)  15.5(H)  Hemoglobin 12.0 - 15.0 g/dL - 9.2(L) 10.2(L)  Hematocrit 36.0 - 46.0 % - 25.5(L) 30.9(L)  Platelets 150 - 400 K/uL 93(L) 109(L) 147(L)  MCV: 74.3 RDW: 15.9 Neutrophils (%): 84 Neutrophils (Abs): 13.0 Abs Immature Granulocytes: 0.38  CMP Latest Ref Rng & Units 03/14/2021 03/13/2021 03/13/2021  Glucose 70 - 99 mg/dL 174(Y) 814(G) 80  BUN 6 - 20 mg/dL 14 14 81(E)  Creatinine 0.44 - 1.00 mg/dL 5.63(J) 4.97(W) 2.63(Z)  Sodium 135 - 145 mmol/L 128(L) 125(L) 129(L)  Potassium 3.5 - 5.1 mmol/L 2.9(L) 2.9(L) 2.7(LL)  Chloride 98 - 111 mmol/L 102 99 101  CO2 22 - 32 mmol/L 17(L) 17(L) 16(L)  Calcium 8.9 - 10.3 mg/dL 7.6(L) 7.4(L) 8.0(L)  Total Protein 6.5 - 8.1 g/dL 5.3(L) 5.4(L) -  Total Bilirubin 0.3 - 1.2 mg/dL 1.2 8.5(Y) -  Alkaline Phos 38 - 126 U/L 49 48 -  AST 15 - 41 U/L 60(H) 58(H) -  ALT 0 - 44 U/L 45(H) 43 -   Hepatitis B:  Core Antibody - non-reactive  Surface Antibody - non-reactive  Surface Antigen - non-reactive  DIC Panel:  Prothrombin Time - 15.6  INR - 1.3  aPTT - 25 Fibrinogen - 654 D-dimer - 7.69 Smear - No schistocytes   HIV - non-reactive   Blood Culture - Gram(+) Cocci in chains; S. Pyogenes detected  GI Panel - negative   C. Diff Screen - negative   GC/Chlamydia - pending  Sputum culture - pending  Urine culture - pending   Imaging (since admission): U/S Pelvis (Transabdominal and Transvaginal):  No significant abnormalities. There is no evidence of ovarian torsion.   Assessment/Plan:  In summary, Amy Murray is a 30 y.o. woman who presents with generalized abdominal pain. Admitted for sepsis secondary to S. Pyogenes bacteremia.    Active Problems:   Sepsis (HCC)  #Sepsis secondary to bacteremia w/ S. Pyogenes  Patient notes feeling better today. Vitals indicate an improvement in patient's sepsis: temperature, respiratory rate, and heart rate have all decreased. Blood pressure is normal. Down-trending LFTs in setting of normalized  renal function and lactic acid reduce concern for end-organ failure at this time. Symptomatically, patient has also improved with reduced abdominal pain, nausea, and vomiting. However, she notes bilateral upper extremity pain, localized at site of recent IV administration. Blood cultures were positive for S. Pyogenes. GI panel, C. Diff panel were negative. Remaining infectious work-up (GC/Chlamydia, sputum culture, urine culture) still pending. WBC count, absolute neutrophil count remain elevated. Source of bacteremia remains unknown although it is possible that recent IV administration may have been the cause of seeding. Pharmacology has been consulted, and they recommend switching Ceftriaxone for IV Penicillin for coverage targeted at S. Pyogenes.   Plan:  - IV Penicillin G 12 million units over 12 hours, continuous infusion for S. Pyogenes Bacteremia  - d/c IV ceftriaxone 2 g daily; d/c IV metronidazole 500 mg Q8H  - f/u sputum culture, urine culture, GC/Chlamydia  - Continue to follow blood cultures - CBC BID to monitor WBC, neutrophil count    - IVF LR 100 cc/hr for 24 hours - IV or PO Zofran 4 mg PRN for nausea and vomiting  #Hypokalemia  On admission, serum potassium was decreased at 2.2. Improved to 2.9 this morning. Likely secondary to multiple episodes of vomiting and diarrhea. Plan: - Continue to replete with 40 mEq KCl PO; IV KCl 10 mEq in 100 mL over 6 hrs   - Encourage diet-based repletion   - f/u CMP BID to monitor electrolytes and replete  #Hypovolemic Hyponatremia  Asymptomatic mild hypovolemic hyponatremia which is improving with fluids. Likely secondary to several episodes of vomiting, diarrhea. On admission, serum sodium was 123. Partially repleted to 128 this morning.  Plan:  - Encourage diet-based repletion  - LR 100cc/hr for 24 hours - f/u CMP to monitor electrolytes  #Microcytic anemia Patient's hemoglobin 10.2 on admission. Down to 9.2 this morning. No active  bleeding, no bleeding at puncture sites. One prior pregnancy noted and last menstrual cycle began on 03/06/2021. Overnight decrease in Hgb is likely dilutional as patient was aggressively volume repleted with IVF since admission. DIC ruled out with normal fibrinogen, no schistocytes on DIC panel. Vasculitis is possible given systemic symptoms of joint pain, fever. No history of autoimmune diseases, no rash.    Plan:  - Trend hemoglobin on CBC - f/u ANA results  - Holding off on checking iron studies during acute infection  #Elevated D-dimer  Patient presents with elevated D-dimer of 7.69. Well's score was 1.5 points, suspicion for PE is low. No evidence of DVT on exam although patient does have right upper extremity and pain.  Plan: - Repeat D-dimer  - CTA Chest if D-dimer remains elevated   #AKI Creatinine of 1.61 on admission, improved to 1.03 this morning. Likely pre-renal azotemia secondary to volume loss in setting of vomiting, diarrhea with subsequent improvement following volume repletion. Plan:  - LR 100cc/hr for 24 hours - f/u CMP BID to monitor renal function    LOS: 1 day   Lyda Kalata, Medical  Student 03/14/2021, 1:09 PM

## 2021-03-14 NOTE — Progress Notes (Signed)
PHARMACY - PHYSICIAN COMMUNICATION CRITICAL VALUE ALERT - BLOOD CULTURE IDENTIFICATION (BCID)  Amy Murray is an 30 y.o. female who presented to [facility] on 03/13/2021 with a chief complaint of abdominal pain.  Assessment:  Blood cultures growing GPCs in chains in 3/4 bottles. BCID reports Strep pyogenes. C. Diff negative. GI PCR and Sputum cultures pending. Patient currently on ceftriaxone and Flagyl for possible abdominal infection and sepsis.   Name of physician (or Provider) ContactedEvie Lacks, MD  Current antibiotics: Ceftriaxone 2g IV q24h, Flagyl 500 mg IV q8h  Changes to prescribed antibiotics recommended:  Stop ceftriaxone and Flagyl  Start Penicillin G 4 million units IV q4h  Results for orders placed or performed during the hospital encounter of 03/13/21  Blood Culture ID Panel (Reflexed) (Collected: 03/13/2021  2:36 PM)  Result Value Ref Range   Enterococcus faecalis NOT DETECTED NOT DETECTED   Enterococcus Faecium NOT DETECTED NOT DETECTED   Listeria monocytogenes NOT DETECTED NOT DETECTED   Staphylococcus species NOT DETECTED NOT DETECTED   Staphylococcus aureus (BCID) NOT DETECTED NOT DETECTED   Staphylococcus epidermidis NOT DETECTED NOT DETECTED   Staphylococcus lugdunensis NOT DETECTED NOT DETECTED   Streptococcus species DETECTED (A) NOT DETECTED   Streptococcus agalactiae NOT DETECTED NOT DETECTED   Streptococcus pneumoniae NOT DETECTED NOT DETECTED   Streptococcus pyogenes DETECTED (A) NOT DETECTED   A.calcoaceticus-baumannii NOT DETECTED NOT DETECTED   Bacteroides fragilis NOT DETECTED NOT DETECTED   Enterobacterales NOT DETECTED NOT DETECTED   Enterobacter cloacae complex NOT DETECTED NOT DETECTED   Escherichia coli NOT DETECTED NOT DETECTED   Klebsiella aerogenes NOT DETECTED NOT DETECTED   Klebsiella oxytoca NOT DETECTED NOT DETECTED   Klebsiella pneumoniae NOT DETECTED NOT DETECTED   Proteus species NOT DETECTED NOT DETECTED   Salmonella  species NOT DETECTED NOT DETECTED   Serratia marcescens NOT DETECTED NOT DETECTED   Haemophilus influenzae NOT DETECTED NOT DETECTED   Neisseria meningitidis NOT DETECTED NOT DETECTED   Pseudomonas aeruginosa NOT DETECTED NOT DETECTED   Stenotrophomonas maltophilia NOT DETECTED NOT DETECTED   Candida albicans NOT DETECTED NOT DETECTED   Candida auris NOT DETECTED NOT DETECTED   Candida glabrata NOT DETECTED NOT DETECTED   Candida krusei NOT DETECTED NOT DETECTED   Candida parapsilosis NOT DETECTED NOT DETECTED   Candida tropicalis NOT DETECTED NOT DETECTED   Cryptococcus neoformans/gattii NOT DETECTED NOT DETECTED    Trixie Rude, PharmD PGY1 Acute Care Pharmacy Resident 03/14/2021 9:56 AM  Please check [email or web address] for unit-specific pharmacy phone numbers.

## 2021-03-15 LAB — COMPREHENSIVE METABOLIC PANEL
ALT: 31 U/L (ref 0–44)
AST: 41 U/L (ref 15–41)
Albumin: 1.9 g/dL — ABNORMAL LOW (ref 3.5–5.0)
Alkaline Phosphatase: 73 U/L (ref 38–126)
Anion gap: 7 (ref 5–15)
BUN: 12 mg/dL (ref 6–20)
CO2: 24 mmol/L (ref 22–32)
Calcium: 7.9 mg/dL — ABNORMAL LOW (ref 8.9–10.3)
Chloride: 104 mmol/L (ref 98–111)
Creatinine, Ser: 0.7 mg/dL (ref 0.44–1.00)
GFR, Estimated: >60 mL/min (ref >60)
Glucose, Bld: 102 mg/dL — ABNORMAL HIGH (ref 70–99)
Potassium: 3.3 mmol/L — ABNORMAL LOW (ref 3.5–5.1)
Sodium: 135 mmol/L (ref 135–145)
Total Bilirubin: 1.2 mg/dL (ref 0.3–1.2)
Total Protein: 5.3 g/dL — ABNORMAL LOW (ref 6.5–8.1)

## 2021-03-15 LAB — CBC
HCT: 26.6 % — ABNORMAL LOW (ref 36.0–46.0)
Hemoglobin: 9.3 g/dL — ABNORMAL LOW (ref 12.0–15.0)
MCH: 25.5 pg — ABNORMAL LOW (ref 26.0–34.0)
MCHC: 35 g/dL (ref 30.0–36.0)
MCV: 72.9 fL — ABNORMAL LOW (ref 80.0–100.0)
Platelets: 89 10*3/uL — ABNORMAL LOW (ref 150–400)
RBC: 3.65 MIL/uL — ABNORMAL LOW (ref 3.87–5.11)
RDW: 15.8 % — ABNORMAL HIGH (ref 11.5–15.5)
WBC: 16.7 10*3/uL — ABNORMAL HIGH (ref 4.0–10.5)
nRBC: 0 % (ref 0.0–0.2)

## 2021-03-15 LAB — PATHOLOGIST SMEAR REVIEW

## 2021-03-15 LAB — URINE CYTOLOGY ANCILLARY ONLY
Chlamydia: NEGATIVE
Comment: NEGATIVE
Comment: NORMAL
Neisseria Gonorrhea: NEGATIVE

## 2021-03-15 MED ORDER — PENICILLIN G POTASSIUM 20000000 UNITS IJ SOLR
12.0000 10*6.[IU] | Freq: Two times a day (BID) | INTRAVENOUS | Status: DC
  Administered 2021-03-15 – 2021-03-20 (×12): 12 10*6.[IU] via INTRAVENOUS
  Filled 2021-03-15 (×14): qty 12

## 2021-03-15 MED ORDER — POTASSIUM CHLORIDE 20 MEQ PO PACK
40.0000 meq | PACK | Freq: Once | ORAL | Status: AC
  Administered 2021-03-15: 40 meq via ORAL
  Filled 2021-03-15: qty 2

## 2021-03-15 MED ORDER — POTASSIUM CHLORIDE CRYS ER 20 MEQ PO TBCR
40.0000 meq | EXTENDED_RELEASE_TABLET | Freq: Two times a day (BID) | ORAL | Status: DC
  Administered 2021-03-15: 40 meq via ORAL
  Filled 2021-03-15: qty 2

## 2021-03-15 NOTE — Progress Notes (Signed)
Subjective: This is hospital day #2 for Amy Murray, a 30 y.o. woman with a past medical history of bipolar disorder, PTSD, and ADHD who presentedwith generalized abdominal pain and admittedfor sepsis secondary to S. Pyogenes bacteremia.     No acute overnight events.   This morning, patient reports feeling well but notes that she is tired despite getting good sleep. Has no abdominal pain and is able to eat without nausea, vomiting. States that diarrhea has improved as recent stools feel more firm. Notes improvement in bilateral upper extremity pain and swelling with increased range of motion. Still has limited ability to move right hand (fingers, wrist) but no longer has pronounced pain of the right wrist. No shortness of breath, cough.    Objective: Vital signs in last 24 hours:  03/14/21 2057 03/14/21 2350 03/15/21 0251  BP: 120/75 111/81 124/65  Pulse: 99 99 100  Resp: 19 19 19   Temp: (!) 100.9 F (38.3 C) 99.3 F (37.4 C) (!) 100.9 F (38.3 C)  TempSrc: Oral Oral Oral  SpO2: 100% 100% 100%   Filed Weights   03/15/21 0349  Weight: 67.9 kg (first recorded weight    Physical Exam General: Thin, young woman; smiling and conversational; appears comfortable; occasionally moving arms and legs without pain Cardiovascular: Regular rate, rhythm; no murmurs, rubs, gallops  Lungs: Normal pulmonary effort Abdomen: Normal bowel sounds, soft, non-distended; no tenderness to palpation Extremities: No tenderness to palpation; Limited ROM, bilateral upper extremities; no edema Skin: Increased warmth to touch, dry  Neuro: 5/5 strength, LUE, LLE, RLE; 4/5 strength, RUE Psychiatric: Normal attention; elated mood; rapid speech; hyperactive behavior   Intake/Output Summary (Last 24 hours) at 03/15/2021 0547 Last data filed at 03/15/2021 0400 Gross per 24 hour  Intake 1783.56 ml  Output 1000 ml  Net 783.56 ml   Labs in Last 24 Hours:   DIC Panel:  PT - 15.6 INR - 1.3 aPTT - 25    Fibrinogen - 654  D-dimer: 5.45 (from 7.69)  CMP Latest Ref Rng & Units 03/15/2021 03/14/2021 03/14/2021  Glucose 70 - 99 mg/dL 03/16/2021) 73 161(W)  BUN 6 - 20 mg/dL 12 11 14   Creatinine 0.44 - 1.00 mg/dL 960(A 5.40)  Sodium 135 - 145 mmol/L 135 132(L) 128(L)  Potassium 3.5 - 5.1 mmol/L 3.3(L) 3.2(L) 2.9(L)  Chloride 98 - 111 mmol/L 104 101 102  CO2 22 - 32 mmol/L 24 17(L) 17(L)  Calcium 8.9 - 10.3 mg/dL 7.9(L) 8.0(L) 7.6(L)  Total Protein 6.5 - 8.1 g/dL 5.3(L) 5.5(L) 5.3(L)  Total Bilirubin 0.3 - 1.2 mg/dL 1.2 9.81) 1.2  Alkaline Phos 38 - 126 U/L 73 65 49  AST 15 - 41 U/L 41 53(H) 60(H)  ALT 0 - 44 U/L 31 40 45(H)   CBC Latest Ref Rng & Units 03/14/2021 03/14/2021  WBC 4.0 - 10.5 K/uL 17.4(H) 15.3(H)  Hemoglobin 12.0 - 15.0 g/dL 03/16/2021) 03/16/2021)  Hematocrit 36.0 - 46.0 % 25.5(L) 25.5(L)  Platelets 150 - 400 K/uL 96(L) 109(L)  MCV: 72.6 RDW: 16.0  Hepatitis B:  Core Antibody - negative Surface Antigen - negative Surface Antibody - negative   Blood Smear (Pathologist Review): MICROCYTIC ANEMIA WITH TARGET CELLS, POLYCHROMASIA, SPHEROCYTES, BASOPHILIC STIPPLING, SCHISTOCYTES AND CIRCULATING 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 THROMBOCYTOPENIA.  C. Diff Stool Assay - negative for toxin or antigen   GI Panel - non-reactive  GC/Chlamydia - negative Sputum Culture - few Group A. Strep (S. Pyogenes), Isolated   Urine Culture -  pending ANA - pending    Imaging in Last 24 Hours:  None   Assessment/Plan: In summary, Amy Murray is a 30 y.o. woman with a past medical history of bipolar disorder, PTSD, and ADHD who is admittedfor evaluation and medical management of sepsis secondary to S. Pyogenes bacteremia.     Active Problems:   Sepsis (HCC)  #Sepsis secondary to bacteremia w/ S. Pyogenes  Patient's presented with sepsis due to S. Pyogenes bacteremia as demonstrated on blood cultures. Source of infection unknown but likely secondary to cellulitis given  history of upper extremity swelling, pain. GI panel, C. Diff assay, GC/chlamydia were negative. Sputum culture did grow few S. Pyogenes -- no signs of pharyngitis, pneumonia at this time (no cough, no SOB, no sore throat). She continues to improve with medical management (IVF, IV antibiotics) and supportive care. Sepsis resolved as patient is hemodynamically stable without signs of end-organ failure. However, remains febrile despite scheduled tylenol with elevated WBC count, suggesting ongoing infection.    Plan:  - IV Penicillin 12 million units, continuous infusion  - Daily CBC to monitor WBC count - Encourage continued oral hydration, monitor I/O and weight, d/c IVF LR - f/u blood culture susceptibilities     #Bipolar Disorder, Active  Patient has been diagnosed with bipolar disorder. Was previously seeing a psychiatrist but has not been evaluated since May 2021. Appears hyperactive with elated mood during today's encounter. She also notes reduced range of motion and strength on exam due to fatigue. However, patient appears to intermittently move extremities without difficulty during today's encounter. 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 She would like to be seen by inpatient psychiatry team and desires to be connected with a Child psychotherapist to establish PCP and outpatient psychiatry care.    Plan: - Appreciate inpatient psychiatry evaluation - f/u TOC to establish adequate outpatient support, medical management  #Microcytic anemia   Patient'shemoglobin 10.2 on admission. Stable at 9.1 (<9.2 yesterday) this morning. Dilution via IVF administration and repeated blood draws likely contributed to initial decline in Hgb. Blood smear suggestive of beta-thalassemia, which is consistent with lab abnormalities. No signs of active bleeding. Plan:  - Trend  hemoglobinon daily CBC - Holdingoff on checking iron studies duringactive infection  #Hypokalemia On admission, serum potassium was decreased at 2.2 secondary to multiple episodes of vomiting and diarrhea. Improved to 3.3 this morning with oral and IV repletion. Mg was normal at 2.3 on admission.  Plan: - Continue to replete with 40 mEq KCl PO for two doses   - Encourage diet-based repletion  - Recheck Mg levels in morning - f/u CMP to monitor electrolytes  #Hypovolemic Hyponatremia Resolved. Patient presented with asymptomatic hypovolemic hyponatremia on admission, secondary to vomiting, diarrhea. Improved with IVF, dietary repletion. Normal at 135 this morning.  Plan:   - f/u CMP to monitor electrolytes  #AKI Resolved, Cr normal today at 0.70. Creatinine of 1.61 on admission likely secondary to pre-renal azotemia. Plan:  - f/u CMP BID to monitor renal function   #Elevated D-dimer  Patient had elevated D-dimer of 7.69 yesterday morning, repeat of 5.45 yesterday afternoon. Likely secondary to sepsis. Well's score is 1.5, suspicion for PE is low. No evidence of DVT on exam. DIC unlikely with elevated fibrinogen on panel, no active bleeding, and improved condition of patient. HUS unlikely in absence of shigella or shiga-like toxin producing pathogens on GI panel.  Diet:Regular VTE ppx: Enoxaparin  Code status:Full    LOS: 2 days   Lyda Kalata, Medical Student 03/15/2021, 1:14 PM

## 2021-03-15 NOTE — Plan of Care (Signed)

## 2021-03-16 DIAGNOSIS — R042 Hemoptysis: Secondary | ICD-10-CM

## 2021-03-16 DIAGNOSIS — F909 Attention-deficit hyperactivity disorder, unspecified type: Secondary | ICD-10-CM

## 2021-03-16 DIAGNOSIS — F4310 Post-traumatic stress disorder, unspecified: Secondary | ICD-10-CM

## 2021-03-16 DIAGNOSIS — F4312 Post-traumatic stress disorder, chronic: Secondary | ICD-10-CM

## 2021-03-16 DIAGNOSIS — F901 Attention-deficit hyperactivity disorder, predominantly hyperactive type: Secondary | ICD-10-CM

## 2021-03-16 DIAGNOSIS — F319 Bipolar disorder, unspecified: Secondary | ICD-10-CM

## 2021-03-16 LAB — COMPREHENSIVE METABOLIC PANEL
ALT: 26 U/L (ref 0–44)
AST: 31 U/L (ref 15–41)
Albumin: 1.9 g/dL — ABNORMAL LOW (ref 3.5–5.0)
Alkaline Phosphatase: 80 U/L (ref 38–126)
Anion gap: 9 (ref 5–15)
BUN: 10 mg/dL (ref 6–20)
CO2: 26 mmol/L (ref 22–32)
Calcium: 8 mg/dL — ABNORMAL LOW (ref 8.9–10.3)
Chloride: 99 mmol/L (ref 98–111)
Creatinine, Ser: 0.67 mg/dL (ref 0.44–1.00)
GFR, Estimated: >60 mL/min (ref >60)
Glucose, Bld: 92 mg/dL (ref 70–99)
Potassium: 3.1 mmol/L — ABNORMAL LOW (ref 3.5–5.1)
Sodium: 134 mmol/L — ABNORMAL LOW (ref 135–145)
Total Bilirubin: 1.3 mg/dL — ABNORMAL HIGH (ref 0.3–1.2)
Total Protein: 5.1 g/dL — ABNORMAL LOW (ref 6.5–8.1)

## 2021-03-16 LAB — LIPID PANEL
Cholesterol: 182 mg/dL (ref 0–200)
HDL: <10 mg/dL — ABNORMAL LOW (ref >40)
Triglycerides: 317 mg/dL — ABNORMAL HIGH (ref <150)
VLDL: 63 mg/dL — ABNORMAL HIGH (ref 0–40)

## 2021-03-16 LAB — CULTURE, BLOOD (ROUTINE X 2)

## 2021-03-16 LAB — CULTURE, RESPIRATORY W GRAM STAIN

## 2021-03-16 LAB — CBC
HCT: 26.1 % — ABNORMAL LOW (ref 36.0–46.0)
Hemoglobin: 9 g/dL — ABNORMAL LOW (ref 12.0–15.0)
MCH: 25.3 pg — ABNORMAL LOW (ref 26.0–34.0)
MCHC: 34.5 g/dL (ref 30.0–36.0)
MCV: 73.3 fL — ABNORMAL LOW (ref 80.0–100.0)
Platelets: 118 10*3/uL — ABNORMAL LOW (ref 150–400)
RBC: 3.56 MIL/uL — ABNORMAL LOW (ref 3.87–5.11)
RDW: 16 % — ABNORMAL HIGH (ref 11.5–15.5)
WBC: 18 10*3/uL — ABNORMAL HIGH (ref 4.0–10.5)
nRBC: 0 % (ref 0.0–0.2)

## 2021-03-16 LAB — HEMOGLOBIN A1C
Hgb A1c MFr Bld: 5.8 % — ABNORMAL HIGH (ref 4.8–5.6)
Mean Plasma Glucose: 119.76 mg/dL

## 2021-03-16 LAB — VALPROIC ACID LEVEL: Valproic Acid Lvl: <10 ug/mL — ABNORMAL LOW (ref 50.0–100.0)

## 2021-03-16 LAB — MAGNESIUM: Magnesium: 1.9 mg/dL (ref 1.7–2.4)

## 2021-03-16 MED ORDER — DIVALPROEX SODIUM ER 500 MG PO TB24
500.0000 mg | ORAL_TABLET | Freq: Two times a day (BID) | ORAL | Status: DC
  Administered 2021-03-16 – 2021-03-21 (×9): 500 mg via ORAL
  Filled 2021-03-16 (×13): qty 1

## 2021-03-16 MED ORDER — POTASSIUM CHLORIDE 20 MEQ PO PACK
40.0000 meq | PACK | Freq: Once | ORAL | Status: AC
  Administered 2021-03-16: 40 meq via ORAL
  Filled 2021-03-16: qty 2

## 2021-03-16 MED ORDER — ACETAMINOPHEN 325 MG PO TABS: 650.0000 mg | ORAL_TABLET | Freq: Four times a day (QID) | ORAL | Status: DC | PRN

## 2021-03-16 MED ORDER — TRAZODONE HCL 100 MG PO TABS
100.0000 mg | ORAL_TABLET | Freq: Every evening | ORAL | Status: DC | PRN
  Administered 2021-03-16 – 2021-03-20 (×4): 100 mg via ORAL
  Filled 2021-03-16 (×6): qty 1

## 2021-03-16 MED ORDER — POTASSIUM CHLORIDE 20 MEQ PO PACK
40.0000 meq | PACK | Freq: Once | ORAL | Status: DC
  Filled 2021-03-16: qty 2

## 2021-03-16 MED ORDER — MAGNESIUM SULFATE 2 GM/50ML IV SOLN
2.0000 g | Freq: Once | INTRAVENOUS | Status: AC
  Administered 2021-03-16: 2 g via INTRAVENOUS
  Filled 2021-03-16: qty 50

## 2021-03-16 NOTE — Progress Notes (Signed)
Subjective:  Overnight, no acute events.  This morning, patient reports that she feels very tired. She states that she has been having a cough productive of blood tinged sputum since Friday. She reports that today is Wednesday. She also endorses abdominal pain "everywhere." She states that she has been having diarrhea and experienced fifty-five bowel movements today. She reports that she enjoyed working with physical therapy today as well as her meeting with the psychiatry team today. She reports that she has not yet had a meal today, however she states that she has an appetite and looking forward to enjoying the dinner in front of her. She has no further questions or concerns for our team at this time.  Objective:  Vital signs in last 24 hours: Vitals:   03/16/21 0010 03/16/21 0415 03/16/21 0900 03/16/21 1226  BP: (!) 107/59 115/74 113/78 124/75  Pulse: 98 98 97 96  Resp: 16 16  19   Temp: 98.6 F (37 C) 99.7 F (37.6 C) 98 F (36.7 C) 98.1 F (36.7 C)  TempSrc: Oral Oral Oral Oral  SpO2: 99% 98% 98% 98%  Weight:      Height:        Intake/Output Summary (Last 24 hours) at 03/16/2021 1632 Last data filed at 03/16/2021 1549 Gross per 24 hour  Intake 1227.37 ml  Output 600 ml  Net 627.37 ml   Filed Weights   03/15/21 0349  Weight: 67.9 kg  Physical Exam Vitals reviewed. Exam conducted with a chaperone present.  Constitutional:      General: She is not in acute distress.    Appearance: She is not ill-appearing.     Comments: Young woman, sitting on edge of hospital bed, rapidly scanning the room, with full meal on meal tray overlying her lap. Patient attempting to hold examiner's arm while auscultating her chest. Patient talking throughout auscultation. Patient begins counting from one to ten during examination.  Cardiovascular:     Rate and Rhythm: Normal rate and regular rhythm.     Heart sounds: Normal heart sounds. No murmur heard.   Pulmonary:     Effort: Pulmonary  effort is normal. No respiratory distress.     Breath sounds: Normal breath sounds.  Musculoskeletal:     Right lower leg: No edema.     Left lower leg: No edema.  Neurological:     General: No focal deficit present.     Mental Status: She is alert. She is confused.     Motor: Motor function is intact.  Psychiatric:        Attention and Perception: She is inattentive.        Mood and Affect: Affect is flat and inappropriate.        Speech: Speech is rapid and pressured.        Behavior: Behavior is slowed.    Labs in last 24 hours: CBC Latest Ref Rng & Units 03/16/2021 03/15/2021 03/14/2021  WBC 4.0 - 10.5 K/uL 18.0(H) 16.7(H) 17.4(H)  Hemoglobin 12.0 - 15.0 g/dL 9.0(L) 9.3(L) 9.1(L)  Hematocrit 36.0 - 46.0 % 26.1(L) 26.6(L) 25.5(L)  Platelets 150 - 400 K/uL 118(L) 89(L) 96(L)   CMP Latest Ref Rng & Units 03/16/2021 03/15/2021 03/14/2021  Glucose 70 - 99 mg/dL 92 03/16/2021) 73  BUN 6 - 20 mg/dL 10 12 11   Creatinine 0.44 - 1.00 mg/dL 696(E 9.52  Sodium 135 - 145 mmol/L 134(L) 135 132(L)  Potassium 3.5 - 5.1 mmol/L 3.1(L) 3.3(L) 3.2(L)  Chloride 98 -  111 mmol/L 99 104 101  CO2 22 - 32 mmol/L 26 24 17(L)  Calcium 8.9 - 10.3 mg/dL 8.0(L) 7.9(L) 8.0(L)  Total Protein 6.5 - 8.1 g/dL 5.1(L) 5.3(L) 5.5(L)  Total Bilirubin 0.3 - 1.2 mg/dL 2.9(H) 1.2 3.7(J)  Alkaline Phos 38 - 126 U/L 80 73 65  AST 15 - 41 U/L 31 41 53(H)  ALT 0 - 44 U/L 26 31 40  Magnesium - 1.9  Blood cultures - Group A Streptococcus, penicillin sensitive Hemoglobin A1c - 5.8 ANA - in process Lipid Panel - in process  Imaging in last 24 hours: No results found.  Assessment/Plan:  Principal Problem:   Sepsis (HCC) Active Problems:   Bipolar I disorder (HCC)   PTSD (post-traumatic stress disorder)   Attention deficit hyperactivity disorder (ADHD), predominantly hyperactive type  Amy Murray is a 30 year old woman with a past medical history of bipolar disorder, PTSD, and ADHD who presented to [facility] on  03/13/21 for evaluation of abdominal pain, nausea and vomiting found to have sepsis2/2 S. Pyogenes bacteremia.  #Streptococcus pyogenes bacteremia, active Patient is hemodynamically stable with Tmax of 100.9 on 03/15/21 1212 despite scheduled acetaminophen every six hours. Patient's leukocytosis progressing most recently to 18.0 which is highest since admission. Patient reporting today on evaluation several days of hemoptysis and profuse diarrhea, however patient has denied nausea, vomiting, diarrhea, cough over the past several days of evaluation. Patient has received four days of IV antibiotics for her known streptococcus pyogenes bacteremia. Patient would benefit from at least fourteen days of antibiotics, however she may transition to oral regimen pending further clinical improvement. -Continue IV Penicillin 12 million units Q12H (day 4) -Continue daily CBC for monitoring leukocytosis -Correlate patient's reports of hemoptysis and diarrhea with nursing -Discontinue scheduled acetaminophen, transition to PRN -Monitor fever curve  #Bipolar disorder, chronic #PTSD, chronic #ADHD, chronic Patient's mood, behavior, attention significantly changed from prior days of examination, with concern for possible component of psychosis. Of note, patient had completed extended evaluation by psychiatry team prior to our encounter. Patient previously experienced psychiatric hospitalization in [facility] due to mania with insomnia and absent oral intake for five days. She was prescribed trazodone 100mg  nightly, sertraline 50mg  daily, depakote 500mg  twice daily and hydroxyzine 25mg  TID PRN, however she has not been taking these medications consistently for at least the past several months. Plan: -Psychiatry following, greatly appreciate recommendations  -Restart home Depakote ER 500mg  twice daily  -Restart home trazodone 100mg  at bedtime PRN  -Hold sertraline 50mg  daily for now  -Hold hydroxyzine 25mg  Q8H PRN  for now  -Psychiatry to reassess on Monday, 03/19/21  #Microcytic anemia, stable Patient's microcytic anemia is likely chronic as she is exhibiting no signs of active bleeding since admission. Plan:  -Daily CBC, monitor hemoglobin -Patient will need iron studies and possibly electrophoresis following discharge for better categorization of her microcytic anemia  #Hypokalemia, active Patient continues to have hypokalemia throughout hospitalization, although improved from admission. Plan: -Continue to replete orally (although nursing has reported patient requires encouragement to take supplementation)  #Hypovolemic Hyponatremia, resolved Patient presented with hyponatremia (123) in the setting of hypovolemia secondary to poor oral intake, diarrhea and vomiting which has since improved to 134 most recently.  #AKI, resolved Creatinine 1.61 on admission with continued trend down most recently to 0.67. AKI on presentation likely pre-renal in the setting of diarrhea, vomiting and poor oral intake.  #Diet:Regular #VTE ppx: Enoxaparin  #Code status:Full code #PT recs: Home health PT  Rolling walker with 5" wheels (  TBD) #OT recs: Awaiting evaluation  Prior to Admission Living Arrangement: Home Anticipated Discharge Location: Home Barriers to Discharge: Continued medical workup Dispo: Anticipated discharge in approximately 2-3 day(s).   Roylene Reason, MD 03/16/2021, 4:32 PM Pager: [phone] After 5pm on weekdays and 1pm on weekends: On Call pager [phone]

## 2021-03-16 NOTE — TOC Initial Note (Addendum)
Transition of Care [facility]) - Initial/Assessment Note    Patient Details  Name: Amy Murray MRN: [id number] Date of Birth: 04-11-1991  [facility] CM/SW Contact:    Joanne Chars, LCSW Phone Number: 03/16/2021, 10:26 AM  Clinical Narrative:    CSW met with pt regarding TOC consult for PCP and psychiatry appts in the community.  Psychiatrist present initially and requesting records from a prior psychiatric hospitalization in [place] at [facility].  Pt is from [facility], moved to [place] for a "change."  Permission given to speak with mother, Ardelle Balls, [phone], who remains in [place].    Pt agreeable to referral to [facility] for PCP.    [place] located online.  P: [phone].  Fax: [phone].  CSW LM with medical records.   1400: CSW spoke with pt mother Ardelle Balls.  Pt left [place] in October, went to [place] initially, has been in [place] since late January 2022.  Pt was hospitalized 3-4 years ago, did stay on psych meds up until she left [facility].  She seemed to do OK after she left and was no longer taking them up until about 2 weeks ago.  [facility] [address], [place], [phone].  [facility]  Pt was taking the following meds: Trazodone: 157m 1 qhs Sertraline: 571m1 qam Depakote: 50076m BID Hydroxyzine: 61m5m PRN  No other medications on file there.           Expected Discharge Plan: Home/Self Care Barriers to Discharge: Continued Medical Work up   Patient Goals and CMS Choice Patient states their goals for this hospitalization and ongoing recovery are:: "be at peace" CMS Medicare.gov Compare Post Acute Care list provided to::  (na)    Expected Discharge Plan and Services Expected Discharge Plan: Home/Self Care In-house Referral: Clinical Social Work   Post Acute Care Choice: NA Living arrangements for the past 2 months:  [place]                                  Prior Living Arrangements/Services Living arrangements for the past 2 months: Single Family Home Lives with:: Friends Patient language and need for interpreter reviewed:: Yes Do you feel safe going back to the place where you live?: Yes      Need for Family Participation in Patient Care: No (Comment) Care giver support system in place?: Yes (comment) Current home services: Other (comment) (none) Criminal Activity/Legal Involvement Pertinent to Current Situation/Hospitalization: No - Comment as needed  Activities of Daily Living Home Assistive Devices/Equipment: None ADL Screening (condition at time of admission) Patient's cognitive ability adequate to safely complete daily activities?: Yes Is the patient deaf or have difficulty hearing?: No Does the patient have difficulty seeing, even when wearing glasses/contacts?: No Does the patient have difficulty concentrating, remembering, or making decisions?: No Patient able to express need for assistance with ADLs?: Yes Does the patient have difficulty dressing or bathing?: No Independently performs ADLs?: Yes (appropriate for developmental age) Does the patient have difficulty walking or climbing stairs?: No Weakness of Legs: None Weakness of Arms/Hands: None  Permission Sought/Granted Permission sought to share information with : Family Supports Permission granted to share information with : Yes, Verbal Permission Granted  Share Information with NAME: mother, BrenArdelle [phone]  Emotional Assessment Appearance:: Appears stated age Attitude/Demeanor/Rapport: Engaged Affect (typically observed): Appropriate Orientation: : Oriented to Self,Oriented to Place,Oriented to  Time,Oriented to Situation Alcohol / Substance Use: Illicit Drugs Psych Involvement: Yes (comment)  Admission diagnosis:  Hypokalemia [E87.6] Hyponatremia [E87.1] Abnormal CT scan  [R93.89] Abdominal pain [R10.9] Sepsis ([place]) [A41.9] Community acquired pneumonia of right lower lobe of lung [J18.9] Sepsis, due to unspecified organism, unspecified whether acute organ dysfunction present [facility]) [A41.9] Patient Active Problem List   Diagnosis Date Noted  . Sepsis ([place]) 03/13/2021   PCP:  Patient, No Pcp Per Pharmacy:   [facility] [place] [address] [place] [address] Phone: [phone] Fax: [phone]     Social Determinants of Health (SDOH) Interventions    Readmission Risk Interventions No flowsheet data found.
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
## 2021-03-16 NOTE — Evaluation (Signed)
Physical Therapy Evaluation Patient Details Name: Amy Murray MRN: [id number] DOB: 1991-11-22 Today's Date: 03/16/2021   History of Present Illness  Amy Murray is a 30 y.o. woman with a past medical history of bipolar disorder, PTSD, ADHD who presents with abdominal pain. Patient states that she was very cold Friday night after getting her hair done, smoking marijuana with her friend, and eating [employer]. On Saturday morning, she woke up feeling weak and had diffuse abdominal pain that was worse on her right-side and flank. She also began to experience several episodes of "explosive", non-bloody diarrhea, which has been intermittently brown and yellow in color. Notes associated nausea and bilious vomiting, which has been worse when trying to eat or drink. Patient also reports pain with urination and darkened urine. Abdominal pain worsened Monday and patient was evaluated at urgent care. Given Zofran and IVF for nausea at that time without relief. Presented to ED today after developing a fever and productive cough in setting of worsening abdominal pain, nausea, vomiting, and weakness. Cough is now productive of green sputum with streaks of blood.    Clinical Impression  Patient received sitting up on the side of the bed. Patient is agreeable to PT assessment. Reports she is tired. Flat affect but will smile on occasion and is pleasant overall. Patient has difficulty following commands or processing what is being asked. She required min assist for sit to stand and min assist for ambulation of 20 feet in room with IV pole assist and my assist on other side. Ambulates with wide BOS and stiff legs. Patient will continue to benefit from skilled PT while here to improve mobility and safety.          Follow Up Recommendations Home health PT;Other (comment) (patient's movement limitations appear more cognitively based rather than strength. Unsure how she will progress)    Equipment Recommendations   Other (comment);Rolling walker with 5" wheels (TBD)    Recommendations for Other Services       Precautions / Restrictions Precautions Precautions: Fall Restrictions Weight Bearing Restrictions: No      Mobility  Bed Mobility               General bed mobility comments: patient received sitting up on the side of the bed    Transfers Overall transfer level: Needs assistance Equipment used: 1 person hand held assist Transfers: Sit to/from Stand Sit to Stand: Min assist            Ambulation/Gait Ambulation/Gait assistance: Min assist Gait Distance (Feet): 20 Feet Assistive device: IV Pole;1 person hand held assist Gait Pattern/deviations: Step-to pattern;Wide base of support;Decreased step length - right;Decreased step length - left Gait velocity: decr   General Gait Details: Patient ambulates with wide stance, stiff legged. Unsteady  Stairs            Wheelchair Mobility    Modified Rankin (Stroke Patients Only)       Balance Overall balance assessment: Needs assistance Sitting-balance support: Feet supported Sitting balance-Leahy Scale: Normal     Standing balance support: Single extremity supported;During functional activity Standing balance-Leahy Scale: Fair Standing balance comment: Reliant on at least single UE assist at this time for steadying.                             Pertinent Vitals/Pain Pain Assessment: Faces Faces Pain Scale: Hurts a little bit Pain Location: R hand Pain Descriptors / Indicators: Discomfort;Sore;Grimacing;Guarding Pain Intervention(s):  Monitored during session    Home Living Family/patient expects to be discharged to:: Private residence Living Arrangements: Non-relatives/Friends Available Help at Discharge: Friend(s);Available PRN/intermittently           Home Equipment: None Additional Comments: Patient states she ambulates without difficulty at baseline, can drive. But then once up she is  walking very stiff legged and unsteady and reports this is how she normally walks. Difficult to get information from or get full assessment.    Prior Function Level of Independence: Independent               Hand Dominance   Dominant Hand: Right    Extremity/Trunk Assessment   Upper Extremity Assessment Upper Extremity Assessment: Difficult to assess due to impaired cognition    Lower Extremity Assessment Lower Extremity Assessment: Difficult to assess due to impaired cognition    Cervical / Trunk Assessment Cervical / Trunk Assessment: Normal  Communication   Communication: No difficulties  Cognition Arousal/Alertness: Awake/alert Behavior During Therapy: Flat affect Overall Cognitive Status: No family/caregiver present to determine baseline cognitive functioning                                 General Comments: patient answers questions, but does not always follow directions. For instance I asid "ok, lets stand up." and she will reply "okay I'm up" but hasn't moved.      General Comments      Exercises     Assessment/Plan    PT Assessment Patient needs continued PT services  PT Problem List Decreased strength;Decreased mobility;Decreased activity tolerance;Decreased balance;Decreased cognition       PT Treatment Interventions Therapeutic exercise;Gait training;Balance training;Functional mobility training;Therapeutic activities;Stair training;Patient/family education    PT Goals (Current goals can be found in the Care Plan section)  Acute Rehab PT Goals Patient Stated Goal: none stated PT Goal Formulation: Patient unable to participate in goal setting Time For Goal Achievement: 03/23/21    Frequency Min 2X/week   Barriers to discharge Decreased caregiver support lives with her best friend and her kids    Co-evaluation               AM-PAC PT "6 Clicks" Mobility  Outcome Measure Help needed turning from your back to your side  while in a flat bed without using bedrails?: A Little Help needed moving from lying on your back to sitting on the side of a flat bed without using bedrails?: A Little Help needed moving to and from a bed to a chair (including a wheelchair)?: A Little Help needed standing up from a chair using your arms (e.g., wheelchair or bedside chair)?: A Little Help needed to walk in hospital room?: A Little Help needed climbing 3-5 steps with a railing? : A Lot 6 Click Score: 17    End of Session Equipment Utilized During Treatment: Gait belt Activity Tolerance: Patient limited by fatigue Patient left: in bed;with call bell/phone within reach Nurse Communication: Mobility status PT Visit Diagnosis: Unsteadiness on feet (R26.81);Difficulty in walking, not elsewhere classified (R26.2);Pain Pain - Right/Left: Right Pain - part of body: Hand    Time: 1315-1330 PT Time Calculation (min) (ACUTE ONLY): 15 min   Charges:   PT Evaluation $PT Eval Moderate Complexity: 1 Mod          Rebbecca Osuna, PT, GCS 03/16/21,1:40 PM

## 2021-03-17 DIAGNOSIS — M79644 Pain in right finger(s): Secondary | ICD-10-CM

## 2021-03-17 DIAGNOSIS — D72829 Elevated white blood cell count, unspecified: Secondary | ICD-10-CM

## 2021-03-17 LAB — CBC
HCT: 24.6 % — ABNORMAL LOW (ref 36.0–46.0)
Hemoglobin: 8.7 g/dL — ABNORMAL LOW (ref 12.0–15.0)
MCH: 25.9 pg — ABNORMAL LOW (ref 26.0–34.0)
MCHC: 35.4 g/dL (ref 30.0–36.0)
MCV: 73.2 fL — ABNORMAL LOW (ref 80.0–100.0)
Platelets: 200 10*3/uL (ref 150–400)
RBC: 3.36 MIL/uL — ABNORMAL LOW (ref 3.87–5.11)
RDW: 15.9 % — ABNORMAL HIGH (ref 11.5–15.5)
WBC: 21.3 10*3/uL — ABNORMAL HIGH (ref 4.0–10.5)
nRBC: 0 % (ref 0.0–0.2)

## 2021-03-17 LAB — ANA: Anti Nuclear Antibody (ANA): NEGATIVE

## 2021-03-17 LAB — BASIC METABOLIC PANEL
Anion gap: 10 (ref 5–15)
BUN: 11 mg/dL (ref 6–20)
CO2: 27 mmol/L (ref 22–32)
Calcium: 7.8 mg/dL — ABNORMAL LOW (ref 8.9–10.3)
Chloride: 97 mmol/L — ABNORMAL LOW (ref 98–111)
Creatinine, Ser: 0.66 mg/dL (ref 0.44–1.00)
GFR, Estimated: >60 mL/min (ref >60)
Glucose, Bld: 102 mg/dL — ABNORMAL HIGH (ref 70–99)
Potassium: 3.5 mmol/L (ref 3.5–5.1)
Sodium: 134 mmol/L — ABNORMAL LOW (ref 135–145)

## 2021-03-17 LAB — MAGNESIUM: Magnesium: 2.1 mg/dL (ref 1.7–2.4)

## 2021-03-17 MED ORDER — POTASSIUM CHLORIDE 20 MEQ PO PACK: 40.0000 meq | PACK | Freq: Once | ORAL | Status: DC

## 2021-03-17 NOTE — Plan of Care (Signed)
  Problem: Activity: Goal: Risk for activity intolerance will decrease Outcome: Not Progressing Note: Pt refuses to ambulate or get out of bed   Problem: Nutrition: Goal: Adequate nutrition will be maintained Outcome: Not Progressing Note: Pt refusing to eat   Problem: Coping: Goal: Level of anxiety will decrease Outcome: Progressing   Problem: Elimination: Goal: Will not experience complications related to bowel motility Outcome: Progressing Goal: Will not experience complications related to urinary retention Outcome: Progressing

## 2021-03-17 NOTE — Evaluation (Signed)
Occupational Therapy Evaluation Patient Details Name: Amy Murray MRN: [id number] DOB: 1991/05/08 Today's Date: 03/17/2021    History of Present Illness Amy Murray is a 30 y.o. woman with a past medical history of bipolar disorder, PTSD, ADHD who presents with abdominal pain. Patient states that she was very cold Friday night after getting her hair done, smoking marijuana with her friend, and eating [employer]. On Saturday morning, she woke up feeling weak and had diffuse abdominal pain that was worse on her right-side and flank. She also began to experience several episodes of "explosive", non-bloody diarrhea, which has been intermittently brown and yellow in color. Notes associated nausea and bilious vomiting, which has been worse when trying to eat or drink. Patient also reports pain with urination and darkened urine. Abdominal pain worsened Monday and patient was evaluated at urgent care. Given Zofran and IVF for nausea at that time without relief. Presented to ED today after developing a fever and productive cough in setting of worsening abdominal pain, nausea, vomiting, and weakness. Cough is now productive of green sputum with streaks of blood.   Clinical Impression   Pt was independent prior to admission. Presents with R UE pain and weakness and R foot pain. Pt transferred x 2 without weight bearing on her R foot due to pain. Inconsistent ability to move R UE during formal testing vs when asked to elevate arm on 2 pillows. Pt requires set up to total assist for ADL. She offers inconsistent responses, asking for therapy to nursing staff, but then not wanting to do therapy when asked, requiring encouragement. Will follow acutely.     Follow Up Recommendations  Home health OT    Equipment Recommendations  3 in 1 bedside commode;Wheelchair (measurements OT);Wheelchair cushion (measurements OT)    Recommendations for Other Services       Precautions / Restrictions  Precautions Precautions: Fall      Mobility Bed Mobility Overal bed mobility: Modified Independent             General bed mobility comments: HOB up    Transfers Overall transfer level: Needs assistance Equipment used: 1 person hand held assist Transfers: Squat Pivot Transfers     Squat pivot transfers: Min assist     General transfer comment: performed bed>BSC>recliner, pt not putting weight on her R LE    Balance Overall balance assessment: Needs assistance   Sitting balance-Leahy Scale: Normal     Standing balance support: Single extremity supported;During functional activity Standing balance-Leahy Scale: Poor Standing balance comment: pt can balance on one foot with single hand supported                           ADL either performed or assessed with clinical judgement   ADL Overall ADL's : Needs assistance/impaired Eating/Feeding: Set up;Sitting Eating/Feeding Details (indicate cue type and reason): with her L hand, issued foam build up with goal of being able to self feed with R hand Grooming: Wash/dry hands;Wash/dry face;Sitting;Set up   Upper Body Bathing: Minimal assistance;Sitting   Lower Body Bathing: Moderate assistance;Sit to/from stand   Upper Body Dressing : Minimal assistance;Sitting Upper Body Dressing Details (indicate cue type and reason): instructed to dress R UE first Lower Body Dressing: Moderate assistance;Sit to/from stand   Toilet Transfer: Minimal assistance;Squat-pivot;BSC   Toileting- Clothing Manipulation and Hygiene: Total assistance;Sit to/from stand         General ADL Comments: Instructed to elevate R UE and use squeeze  ball.     Vision Baseline Vision/History: No visual deficits       Perception     Praxis      Pertinent Vitals/Pain Pain Assessment: Faces Faces Pain Scale: Hurts even more Pain Location: R foot, hand Pain Descriptors / Indicators: Discomfort;Sore;Grimacing;Guarding Pain  Intervention(s): Monitored during session;Repositioned     Hand Dominance Right   Extremity/Trunk Assessment Upper Extremity Assessment Upper Extremity Assessment: RUE deficits/detail RUE Deficits / Details: mild edema, pain with PROM shoulder FF to 80, elbow to 90, full extension of fingers and 3/4 flexion when formally assessed, later demonstrated ability to lift arm and place it with elbow extension on pillows RUE Coordination: decreased fine motor;decreased gross motor   Lower Extremity Assessment Lower Extremity Assessment: Defer to PT evaluation   Cervical / Trunk Assessment Cervical / Trunk Assessment: Normal   Communication Communication Communication: No difficulties   Cognition Arousal/Alertness: Awake/alert Behavior During Therapy: WFL for tasks assessed/performed Overall Cognitive Status: No family/caregiver present to determine baseline cognitive functioning                                 General Comments: pt with poor memory, inconsistent responses to questions   General Comments       Exercises     Shoulder Instructions      Home Living Family/patient expects to be discharged to:: Private residence Living Arrangements: Non-relatives/Friends Available Help at Discharge: Friend(s);Available PRN/intermittently                         Home Equipment: None          Prior Functioning/Environment Level of Independence: Independent        Comments: lives with friend        OT Problem List: Decreased strength;Impaired balance (sitting and/or standing);Decreased knowledge of use of DME or AE;Decreased cognition;Pain      OT Treatment/Interventions: Self-care/ADL training;DME and/or AE instruction;Patient/family education;Balance training;Therapeutic activities;Cognitive remediation/compensation    OT Goals(Current goals can be found in the care plan section) Acute Rehab OT Goals Patient Stated Goal: none stated OT Goal  Formulation: With patient Time For Goal Achievement: 03/31/21 Potential to Achieve Goals: Good ADL Goals Pt Will Perform Eating: with modified independence;sitting Pt Will Perform Grooming: with modified independence;standing Pt Will Perform Upper Body Dressing: with modified independence;sitting Pt Will Perform Lower Body Dressing: with modified independence;sit to/from stand Pt Will Transfer to Toilet: with modified independence;ambulating Pt Will Perform Toileting - Clothing Manipulation and hygiene: with modified independence;sit to/from stand Pt/caregiver will Perform Home Exercise Program: Increased ROM;Increased strength;Right Upper extremity;With Supervision (AROM)  OT Frequency: Min 2X/week   Barriers to D/C:            Co-evaluation              AM-PAC OT "6 Clicks" Daily Activity     Outcome Measure Help from another person eating meals?: A Little Help from another person taking care of personal grooming?: A Little Help from another person toileting, which includes using toliet, bedpan, or urinal?: A Lot Help from another person bathing (including washing, rinsing, drying)?: A Lot Help from another person to put on and taking off regular upper body clothing?: A Little Help from another person to put on and taking off regular lower body clothing?: A Lot 6 Click Score: 15   End of Session Nurse Communication: Mobility status  Activity Tolerance: Patient  tolerated treatment well Patient left: in chair;with call bell/phone within reach  OT Visit Diagnosis: Unsteadiness on feet (R26.81);Pain;Other symptoms and signs involving cognitive function                Time: 6378-5885 OT Time Calculation (min): 53 min Charges:  OT General Charges $OT Visit: 1 Visit OT Evaluation $OT Eval Moderate Complexity: 1 Mod OT Treatments $Self Care/Home Management : 38-52 mins  Martie Round, OTR/L Acute Rehabilitation Services Pager: [phone] Office:  [phone] Amy Murray 03/17/2021, 2:40 PM

## 2021-03-17 NOTE — Progress Notes (Signed)
Subjective:  Overnight, no acute events.  This morning, patient reports remembering being triggered yesterday after her discussion with psychiatry and apologized to IMTS for her behavior. Her best friend is at bedside with her today. She says her right foot is painful and right fingers especially when attempting to grip objects. She asked if she needs stronger antibiotics and we reviewed our evidence behind what she is being prescribed. She otherwise denies diarrhea, cough, abdominal pain or any new symptoms.  Objective:  Vital signs in last 24 hours: Vitals:   03/16/21 2039 03/17/21 0442 03/17/21 0600 03/17/21 1042  BP: 104/70 125/83  114/74  Pulse: 86 85  97  Resp: 20 18  19   Temp: 99.7 F (37.6 C) 99.1 F (37.3 C) 98.6 F (37 C) 98.6 F (37 C)  TempSrc: Oral Oral Oral Oral  SpO2: 95% 93%  99%  Weight:      Height:      On room air  Intake/Output Summary (Last 24 hours) at 03/17/2021 1135 Last data filed at 03/17/2021 0918 Gross per 24 hour  Intake 1865.96 ml  Output 1400 ml  Net 465.96 ml   Filed Weights   03/15/21 0349  Weight: 67.9 kg   Physical Exam Constitutional:      General: She is not in acute distress.    Appearance: She is not ill-appearing.  Cardiovascular:     Rate and Rhythm: Normal rate and regular rhythm.     Heart sounds: Normal heart sounds. No murmur heard.   Pulmonary:     Effort: Pulmonary effort is normal. No respiratory distress.     Breath sounds: Normal breath sounds.  Skin:    General: Skin is warm and dry.     Capillary Refill: Capillary refill takes less than 2 seconds.     Findings: No erythema or rash.  Neurological:     General: No focal deficit present.     Mental Status: She is alert.  Psychiatric:        Attention and Perception: Attention normal.        Mood and Affect: Mood is elated.        Speech: Speech is rapid and pressured.        Behavior: Behavior is cooperative.        Thought Content: Thought content normal.         Cognition and Memory: Cognition and memory normal.        Judgment: Judgment normal.    Labs in last 24 hours: CBC Latest Ref Rng & Units 03/17/2021 03/16/2021 03/15/2021  WBC 4.0 - 10.5 K/uL 21.3(H) 18.0(H) 16.7(H)  Hemoglobin 12.0 - 15.0 g/dL 03/17/2021) 1.6(W) 1.0(X)  Hematocrit 36.0 - 46.0 % 24.6(L) 26.1(L) 26.6(L)  Platelets 150 - 400 K/uL 200 118(L) 89(L)   CMP Latest Ref Rng & Units 03/17/2021 03/16/2021 03/15/2021  Glucose 70 - 99 mg/dL 03/17/2021) 92 557(D)  BUN 6 - 20 mg/dL 11 10 12   Creatinine 0.44 - 1.00 mg/dL 220(U 5.42  Sodium 135 - 145 mmol/L 134(L) 134(L) 135  Potassium 3.5 - 5.1 mmol/L 3.5 3.1(L) 3.3(L)  Chloride 98 - 111 mmol/L 97(L) 99 104  CO2 22 - 32 mmol/L 27 26 24   Calcium 8.9 - 10.3 mg/dL 7.8(L) 8.0(L) 7.9(L)  Total Protein 6.5 - 8.1 g/dL - 5.1(L) 5.3(L)  Total Bilirubin 0.3 - 1.2 mg/dL - 1.3(H) 1.2  Alkaline Phos 38 - 126 U/L - 80 73  AST 15 - 41 U/L - 31 41  ALT 0 - 44 U/L - 26 31  Magnesium - 2.1  Blood cultures - Group A Streptococcus, penicillin sensitive ANA - in process  Imaging in last 24 hours: No results found.  Assessment/Plan:  Principal Problem:   Sepsis (HCC) Active Problems:   Bipolar I disorder (HCC)   PTSD (post-traumatic stress disorder)   Attention deficit hyperactivity disorder (ADHD), predominantly hyperactive type  Amy Murray is a 30 year old woman with a past medical history of bipolar disorder, PTSD, and ADHD who presented to [facility] on 03/13/21 for evaluation of abdominal pain, nausea and vomiting found to have sepsissecondary to streptococcus pyogenes bacteremia.  #Streptococcus pyogenes bacteremia, active Tmax of 99.7 in past 24 hours without acetaminophen administration. Patient is hemodynamically stable. Laboratory workup does reveal continued worsening leukocytosis. Patient currently on day 5 of IV antibiotics (one day of metronidazole and ceftriaxone, four days of penicillin G), however she had a couple hours without  this antibiotic yesterday as she pulled out her IV. Patient would benefit from further workup to ensure there is no endocarditis although there is no audible murmur on examination. -Repeat blood cultures x2 -Obtain echocardiogram complete -Continue IV Penicillin 12 million units Q12H (day 5)  -May transition to oral upon further clinical improvement (ideally infrequent dosing to promote adherence) -Continue daily CBC for monitoring leukocytosis -Monitor fever curve, acetaminophen 650mg  Q6H PRN  #Bipolar disorder, chronic #PTSD, chronic #ADHD, chronic Patient's mood and behavior appear to have returned to baseline this morning. She reports that her behavior yesterday was secondary to discussing her prior trauma with psychiatry. We greatly appreciate continued recommendations and support from our psychiatry colleagues. Plan: -Psychiatry following, greatly appreciate recommendations  -Restarted home Depakote ER 500mg  twice daily  -Restarted home trazodone 100mg  at bedtime PRN  -Hold sertraline 50mg  daily for now  -Hold hydroxyzine 25mg  Q8H PRN for now  -Psychiatry to reassess on Monday, 03/19/21  #Microcytic anemia, stable Patient's microcytic anemia is likely chronic as she is exhibiting no signs of active bleeding since admission. Plan:  -Daily CBC, monitor hemoglobin -Patient will need iron studies and possibly electrophoresis following discharge for better categorization of her microcytic anemia  #Hypokalemia, active Patient continues to have hypokalemia throughout hospitalization, although improved from admission. Plan: -Continue to replete orally (although nursing has reported patient requires encouragement to take supplementation)  #Hypovolemic Hyponatremia, resolved Patient presented with hyponatremia (123) in the setting of hypovolemia secondary to poor oral intake, diarrhea and vomiting which has since improved to 134 most recently.  #AKI, resolved Creatinine 1.61 on  admission with continued trend down most recently to 0.66. AKI on presentation likely pre-renal in the setting of diarrhea, vomiting and poor oral intake.  #Diet:Regular #VTE ppx: Enoxaparin  #Code status:Full code #PT recs: Home health PT   Rolling walker with 5" wheels (TBD) #OT recs: Awaiting evaluation  Prior to Admission Living Arrangement: Home Anticipated Discharge Location: Home Barriers to Discharge: Continued medical workup Dispo: Anticipated discharge in approximately 2-3 day(s).   , MD 03/17/2021, 11:35 AM Pager: [phone] After 5pm on weekdays and 1pm on weekends: On Call pager [phone]

## 2021-03-18 LAB — IRON AND TIBC
Iron: 18 ug/dL — ABNORMAL LOW (ref 28–170)
Saturation Ratios: 8 % — ABNORMAL LOW (ref 10.4–31.8)
TIBC: 224 ug/dL — ABNORMAL LOW (ref 250–450)
UIBC: 206 ug/dL

## 2021-03-18 LAB — BASIC METABOLIC PANEL
Anion gap: 10 (ref 5–15)
BUN: 7 mg/dL (ref 6–20)
CO2: 27 mmol/L (ref 22–32)
Calcium: 7.9 mg/dL — ABNORMAL LOW (ref 8.9–10.3)
Chloride: 98 mmol/L (ref 98–111)
Creatinine, Ser: 0.55 mg/dL (ref 0.44–1.00)
GFR, Estimated: >60 mL/min (ref >60)
Glucose, Bld: 93 mg/dL (ref 70–99)
Potassium: 3.5 mmol/L (ref 3.5–5.1)
Sodium: 135 mmol/L (ref 135–145)

## 2021-03-18 LAB — RETICULOCYTES
Immature Retic Fract: 17.6 % — ABNORMAL HIGH (ref 2.3–15.9)
RBC.: 3.27 MIL/uL — ABNORMAL LOW (ref 3.87–5.11)
Retic Count, Absolute: 6.9 10*3/uL — ABNORMAL LOW (ref 19.0–186.0)
Retic Ct Pct: <0.4 % — ABNORMAL LOW (ref 0.4–3.1)

## 2021-03-18 LAB — CBC
HCT: 23.8 % — ABNORMAL LOW (ref 36.0–46.0)
Hemoglobin: 8.4 g/dL — ABNORMAL LOW (ref 12.0–15.0)
MCH: 25.8 pg — ABNORMAL LOW (ref 26.0–34.0)
MCHC: 35.3 g/dL (ref 30.0–36.0)
MCV: 73.2 fL — ABNORMAL LOW (ref 80.0–100.0)
Platelets: 237 10*3/uL (ref 150–400)
RBC: 3.25 MIL/uL — ABNORMAL LOW (ref 3.87–5.11)
RDW: 16.2 % — ABNORMAL HIGH (ref 11.5–15.5)
WBC: 23.8 10*3/uL — ABNORMAL HIGH (ref 4.0–10.5)
nRBC: 0 % (ref 0.0–0.2)

## 2021-03-18 LAB — FOLATE: Folate: 13.4 ng/mL (ref >5.9)

## 2021-03-18 LAB — MAGNESIUM: Magnesium: 2.2 mg/dL (ref 1.7–2.4)

## 2021-03-18 LAB — VITAMIN B12: Vitamin B-12: 1191 pg/mL — ABNORMAL HIGH (ref 180–914)

## 2021-03-18 LAB — FERRITIN: Ferritin: 408 ng/mL — ABNORMAL HIGH (ref 11–307)

## 2021-03-18 NOTE — Progress Notes (Signed)
Subjective: No acute overnight events.   This morning, patient reports feeling well. She endorses continued right ankle pain that has slightly improved with an ice pack and elevation. Notes continued limited function of her right hand and has been using stress to try and improve mobility and strength. Reports that she is motivated to get better in order to be able to leave the hospital in a few days. She otherwise has no diarrhea, abdominal pain, cough, fever, chills, sore throat.  Objective: Vital signs in last 24 hours: Vitals:   03/17/21 1811 03/17/21 2020 03/18/21 0117 03/18/21 0413  BP: 109/69 120/80 125/70 130/75  Pulse: (!) 108 100 97 99  Resp: 19 20 20 19   Temp: 98.2 F (36.8 C) 98 F (36.7 C) 98 F (36.7 C) 97.9 F (36.6 C)  TempSrc:  Oral  Oral  SpO2: 99% 96% 90% 93%   Physical Exam:  General: Young woman; alert; sitting on chair next to bed with a tray of [employer]; conversational throughout encounter; quiet during auscultation  Cardiovascular: regular rate, rhythm; no murmurs, rubs, gallops Lungs: Normal pulmonary effort; no respiratory distress; clear to auscultation, bilaterally  Extremities: No edema, cyanosis, erythema Neurological: No focal deficits; alert; not confused  Skin: Warm and dry  Psychiatric: Normal attention; elated mood; rapid and pressured speech; cooperative during exam   Intake/Output Summary (Last 24 hours) at 03/18/2021 0615 Last data filed at 03/18/2021 0200 Gross per 24 hour  Intake 1074.01 ml  Output 2400 ml  Net -1325.99 ml   Filed Weights   03/15/21 0349  Weight: 67.9 kg   Labs in Last 24 Hours:   CBC Latest Ref Rng & Units 03/18/2021 03/17/2021 03/16/2021  WBC 4.0 - 10.5 K/uL 23.8(H) 21.3(H) 18.0(H)  Hemoglobin 12.0 - 15.0 g/dL 03/18/2021) 5.3(I) 1.4(E)  Hematocrit 36.0 - 46.0 % 23.8(L) 24.6(L) 26.1(L)  Platelets 150 - 400 K/uL 237 200 118(L)  MCV: 73.2 RDW: 16.2  BMP Latest Ref Rng & Units 03/18/2021 03/17/2021 03/16/2021  Glucose  70 - 99 mg/dL 93 03/18/2021) 92  BUN 6 - 20 mg/dL 7 11 10   Creatinine 0.44 - 1.00 mg/dL 400(Q 6.76  Sodium 135 - 145 mmol/L 135 134(L) 134(L)  Potassium 3.5 - 5.1 mmol/L 3.5 3.5 3.1(L)  Chloride 98 - 111 mmol/L 98 97(L) 99  CO2 22 - 32 mmol/L 27 27 26   Calcium 8.9 - 10.3 mg/dL 7.9(L) 7.8(L) 8.0(L)  Mg: 2.2   Iron/TIBC/Ferritin/ %Sat    Component Value Date/Time   IRON 18 (L) 03/18/2021 0833   TIBC 224 (L) 03/18/2021 0833   FERRITIN 408 (H) 03/18/2021 0833   IRONPCTSAT 8 (L) 03/18/2021 03/20/2021  Vitamin B12 - 1,191 Folate - 13.4  ANA - negative   Blood Culture - pending   Imaging in Last 24 Hours:   Echo - pending   Assessment/Plan:  Principal Problem:   Sepsis (HCC) Active Problems:   Bipolar I disorder (HCC)   PTSD (post-traumatic stress disorder)   Attention deficit hyperactivity disorder (ADHD), predominantly hyperactive type  In summary, this is hospital day #5 for 03/20/2021, 30 year old womanwith a past medical history of bipolar disorder,PTSD, andADHDwho presented for evaluation of abdominal pain, nausea and vomiting. Found to have sepsissecondary to streptococcus pyogenes bacteremia.  #Streptococcus pyogenes bacteremia, active Patient's has improved symptomatically -- no longer has abdominal pain, diarrhea, n/v. She remains afebrile with a Tmax of 98.9 in past 24 hours without acetaminophen administration and is hemodynamically stable. CBC reveals continued worsening leukocytosis, suggesting ongoing  infection with possible poor source control. She is on day 6 of IV antibiotics (one day of metronidazole and ceftriaxone, 5 days of penicillin G). Repeat blood cultures ordered to help evaluate treatment response. Patient would also benefit from further workup to determine source of infection and an echocardiogram has been ordered to evaluate for endocarditis (though this is rare with S. Pyogenes). If negative, will order further imaging (CT) to evaluate for possible  abscess, though this is unlikely in absence of systemic symptoms. No signs of pharyngitis or pneumonia (no cough, sore throat, fever).  - f/u blood cultures x2 - f/u echocardiogram  - Continue IV Penicillin 12 million units Q12H (day 6) - Continue daily CBC for monitoring leukocytosis - Monitor fever curve, acetaminophen 650mg  Q6H PRN  #Bipolar disorder, chronic #PTSD, chronic #ADHD, chronic Patient's mood and behavior continue to be at baseline. She appears motivated to get better in order to leave the hospital. No confusion noted during today's encounter. Mood remains elevated and speech is still somewhat pressured, rapid. We greatly appreciate continued recommendations from our psychiatry colleagues. Plan: - Psychiatry following, greatly appreciate recommendations - Restarted home Depakote ER 500mg  twice daily - Restarted home trazodone 100mg  at bedtime PRN - Hold sertraline 50mg  daily for now - Hold hydroxyzine 25mg  Q8H PRN for now - Psychiatry to reassess on Monday, 03/19/21  #Microcytic anemia, stable Patient's hemoglobin continues to downtrend. Laboratory results (high ferritin, low TIBC, low iron) suggest patient has microcytic anemia secondary to an inflammatory response in setting of ongoing infection. Low reticulocyte count is also consistent with this diagnosis. Beta-thalassemia, as noted in pathology review of blood smear, may also be contributing to patient's microcytic anemia. Unlikely macrocytic component in setting of normal folate with elevated B12.  Plan:  - Hgb electrophoresis to assess for thalassemia  - Daily CBC, monitor hemoglobin   #Hypokalemia, resolved Patient is normokalemic during today's encounter with a potassium of 3.5.   Plan: - Daily BMP to monitor electrolytes  - Continue to replete orally if needed (although nursing reports that patient has not been taking oral supplementation regularly)  #Hypovolemic Hyponatremia, resolved Patient presented  with hyponatremia (123) in the setting of hypovolemia secondary to poor oral intake, diarrhea and vomiting which has since improved to 135 most recently.  #AKI, resolved Creatinine 1.61 on admission with continued trend down most recently to 0.55. AKI on presentation likely pre-renal in the setting of diarrhea, vomiting and poor oral intake.    LOS: 5 days   , Medical Student 03/18/2021, 6:15 AM

## 2021-03-19 ENCOUNTER — Inpatient Hospital Stay (HOSPITAL_COMMUNITY): Payer: Self-pay

## 2021-03-19 DIAGNOSIS — F319 Bipolar disorder, unspecified: Secondary | ICD-10-CM

## 2021-03-19 DIAGNOSIS — A400 Sepsis due to streptococcus, group A: Principal | ICD-10-CM

## 2021-03-19 DIAGNOSIS — M25511 Pain in right shoulder: Secondary | ICD-10-CM

## 2021-03-19 DIAGNOSIS — M79671 Pain in right foot: Secondary | ICD-10-CM

## 2021-03-19 DIAGNOSIS — M25531 Pain in right wrist: Secondary | ICD-10-CM

## 2021-03-19 DIAGNOSIS — F4310 Post-traumatic stress disorder, unspecified: Secondary | ICD-10-CM

## 2021-03-19 DIAGNOSIS — B950 Streptococcus, group A, as the cause of diseases classified elsewhere: Secondary | ICD-10-CM

## 2021-03-19 DIAGNOSIS — R768 Other specified abnormal immunological findings in serum: Secondary | ICD-10-CM

## 2021-03-19 DIAGNOSIS — M25571 Pain in right ankle and joints of right foot: Secondary | ICD-10-CM

## 2021-03-19 DIAGNOSIS — R7881 Bacteremia: Secondary | ICD-10-CM

## 2021-03-19 LAB — ECHOCARDIOGRAM COMPLETE
Area-P 1/2: 4.49 cm2
Height: 60 in
S' Lateral: 2.7 cm
Weight: 2550.28 oz

## 2021-03-19 LAB — CBC
HCT: 24.8 % — ABNORMAL LOW (ref 36.0–46.0)
Hemoglobin: 8.4 g/dL — ABNORMAL LOW (ref 12.0–15.0)
MCH: 25.2 pg — ABNORMAL LOW (ref 26.0–34.0)
MCHC: 33.9 g/dL (ref 30.0–36.0)
MCV: 74.5 fL — ABNORMAL LOW (ref 80.0–100.0)
Platelets: 383 10*3/uL (ref 150–400)
RBC: 3.33 MIL/uL — ABNORMAL LOW (ref 3.87–5.11)
RDW: 16.1 % — ABNORMAL HIGH (ref 11.5–15.5)
WBC: 19.4 10*3/uL — ABNORMAL HIGH (ref 4.0–10.5)
nRBC: 0 % (ref 0.0–0.2)

## 2021-03-19 LAB — BASIC METABOLIC PANEL
Anion gap: 9 (ref 5–15)
BUN: 5 mg/dL — ABNORMAL LOW (ref 6–20)
CO2: 26 mmol/L (ref 22–32)
Calcium: 7.9 mg/dL — ABNORMAL LOW (ref 8.9–10.3)
Chloride: 99 mmol/L (ref 98–111)
Creatinine, Ser: 0.54 mg/dL (ref 0.44–1.00)
GFR, Estimated: >60 mL/min (ref >60)
Glucose, Bld: 99 mg/dL (ref 70–99)
Potassium: 3.9 mmol/L (ref 3.5–5.1)
Sodium: 134 mmol/L — ABNORMAL LOW (ref 135–145)

## 2021-03-19 LAB — VALPROIC ACID LEVEL: Valproic Acid Lvl: 47 ug/mL — ABNORMAL LOW (ref 50.0–100.0)

## 2021-03-19 MED ORDER — DICLOFENAC SODIUM 1 % EX GEL
4.0000 g | Freq: Four times a day (QID) | CUTANEOUS | Status: DC | PRN
  Administered 2021-03-20: 4 g via TOPICAL
  Filled 2021-03-19: qty 100

## 2021-03-19 NOTE — Consult Note (Signed)
Date of Admission:  03/13/2021          Reason for Consult: Group A streptococcus bacteremia without clear source    Referring Provider: Dr. Mayford Knife   Assessment:  1. Group A streptococcus bacteremia  2. Rule out septic shoulder and possible polyarticular septic arthritis including wrist, right foot 3. Rule out endocarditis 4. Bipolar disorder 5. PTSD 6. ADHD 7. Marijuana use 8. Sexually active with female and female partners and may benefit from PrEP for the former (will discuss with her further)  Plan:  1. Would obtain MRI of the right shoulder with contrast 2. Consider transesophageal echocardiogram but would hold while we evaluate for septic arthritis 3. Continue IV penicillin 4. Check HCV and drug screen 5. Will discuss PrEP as an option for the patient either Truvada or IM Apretude (latter would be my preference though if she does not like needles oral PrEP might be preferable   Principal Problem:   Sepsis (HCC) Active Problems:   Bipolar I disorder (HCC)   PTSD (post-traumatic stress disorder)   Attention deficit hyperactivity disorder (ADHD), predominantly hyperactive type   Scheduled Meds: . divalproex  500 mg Oral BID  . enoxaparin (LOVENOX) injection  40 mg Subcutaneous Q24H  . feeding supplement  237 mL Oral TID BM  . multivitamin with minerals  1 tablet Oral Daily   Continuous Infusions: . penicillin g continuous IV infusion 12 Million Units (03/19/21 1137)   PRN Meds:.diclofenac Sodium, traZODone  HPI: Amy Murray is a 30 y.o. female with past medical history significant for PTSD bipolar disorder with predominantly depressive symptoms ADHD who had presented to an urgent care clinic at [facility] and medical clinic.  At that time she was suffering from nausea vomiting diarrhea and fevers.  She had point-of-care Covid and influenza testing which were negative as well as pregnancy test.  Ultimately she worsened and presented to [facility] on the 15th.  Addition to the aforementioned symptoms she was now having pain in her right forearm and wrist.  He also now had a cough.  Chest x-ray showed possible left lower lobe lobe infiltrate.    Blood cultures were taken imaging was done including CT of the abdomen pelvis which was fairly unremarkable.  She also underwent a transvaginal ultrasound did not show any evidence of infection.  Her blood cultures themselves turned positive for group A streptococcus.  He was initially on ceftriaxone but then narrowed to penicillin.  Fevers have improved but she continues to have significant leukocytosis.  He had a transthoracic echocardiogram that that did not show any evidence of endocarditis  On my exam today she has limited range of motion in her right shoulder and tenderness on palpation there.  Her wrist is also tender as is her elbow though the range of motion seems more normal there.  Her right foot is also tender to palpation with possible decreased range of motion.  In talking to her she adamantly denies any history of IV drug use.  She does endorse marijuana use.  She thinks that her pain in the wrist and shoulder and elbow comes from the phlebotomy stick that she had in her wrist.  I am more suspicious that she was already bacteremic when she had fevers and systemic symptoms of nausea and vomiting.  I am worried she may have a septic joint or joints and/or endocarditis.  Certainly endocarditis is not typical with group A streptococci but if we have no  explanation for her bacteremia we will need to go down the pathway of more aggressive imaging of her heart valves.  In the interim I think looking at her joints with an MRI of the shoulder to start with would be the first step.        Review of Systems: Review of Systems  Constitutional: Positive for chills, fever and malaise/fatigue. Negative for diaphoresis and weight loss.  HENT: Negative for congestion,  hearing loss, sore throat and tinnitus.   Eyes: Negative for blurred vision and double vision.  Respiratory: Negative for cough, sputum production, shortness of breath and wheezing.   Cardiovascular: Negative for chest pain, palpitations and leg swelling.  Gastrointestinal: Positive for diarrhea, nausea and vomiting. Negative for abdominal pain, blood in stool, constipation, heartburn and melena.  Genitourinary: Negative for dysuria, flank pain and hematuria.  Musculoskeletal: Positive for joint pain and myalgias. Negative for back pain and falls.  Skin: Negative for itching and rash.  Neurological: Negative for dizziness, sensory change, focal weakness, loss of consciousness, weakness and headaches.  Endo/Heme/Allergies: Does not bruise/bleed easily.  Psychiatric/Behavioral: Positive for depression. Negative for memory loss and suicidal ideas. The patient is not nervous/anxious.     History reviewed. No pertinent past medical history.  Social History   Tobacco Use  . Smoking status: Never Smoker  . Smokeless tobacco: Never Used  Substance Use Topics  . Alcohol use: Not Currently  . Drug use: Not Currently    History reviewed. No pertinent family history. No Known Allergies  OBJECTIVE: Blood pressure 103/64, pulse 91, temperature 98.8 F (37.1 C), temperature source Oral, resp. rate 20, height 5' (1.524 m), weight 72.3 kg, last menstrual period 03/06/2021, SpO2 99 %.  Physical Exam Constitutional:      General: She is not in acute distress.    Appearance: She is not diaphoretic.  HENT:     Head: Normocephalic and atraumatic.     Right Ear: External ear normal.     Left Ear: External ear normal.     Nose: Nose normal.     Mouth/Throat:     Pharynx: No oropharyngeal exudate.  Eyes:     General: No scleral icterus.    Extraocular Movements: Extraocular movements intact.     Conjunctiva/sclera: Conjunctivae normal.     Pupils: Pupils are equal, round, and reactive to light.   Cardiovascular:     Rate and Rhythm: Normal rate and regular rhythm.     Heart sounds: Normal heart sounds. No murmur heard. No friction rub. No gallop.   Pulmonary:     Effort: Pulmonary effort is normal. No respiratory distress.     Breath sounds: Normal breath sounds. No stridor. No wheezing or rales.  Chest:     Chest wall: No tenderness.  Abdominal:     General: Bowel sounds are normal. There is no distension.     Palpations: Abdomen is soft. There is no mass.     Tenderness: There is no abdominal tenderness. There is no rebound.  Musculoskeletal:     Right shoulder: Tenderness present. Decreased range of motion.     Left shoulder: Normal.     Right wrist: Tenderness present. Decreased range of motion.     Left wrist: Normal.     Cervical back: Normal range of motion and neck supple.     Right foot: Decreased range of motion. Tenderness present.     Left foot: Normal.  Lymphadenopathy:     Cervical: No cervical adenopathy.  Skin:  General: Skin is warm and dry.     Coloration: Skin is not pale.     Findings: No erythema or rash.  Neurological:     General: No focal deficit present.     Mental Status: She is alert and oriented to person, place, and time.     Coordination: Coordination normal.  Psychiatric:        Mood and Affect: Mood normal.        Behavior: Behavior normal.        Judgment: Judgment normal.     Lab Results Lab Results  Component Value Date   WBC 19.4 (H) 03/19/2021   HGB 8.4 (L) 03/19/2021   HCT 24.8 (L) 03/19/2021   MCV 74.5 (L) 03/19/2021   PLT 383 03/19/2021    Lab Results  Component Value Date   CREATININE 0.54 03/19/2021   BUN 5 (L) 03/19/2021   NA 134 (L) 03/19/2021   K 3.9 03/19/2021   CL 99 03/19/2021   CO2 26 03/19/2021    Lab Results  Component Value Date   ALT 26 03/16/2021   AST 31 03/16/2021   ALKPHOS 80 03/16/2021   BILITOT 1.3 (H) 03/16/2021     Microbiology: Recent Results (from the past 240 hour(s))   Culture, sputum-assessment     Status: None   Collection Time: 03/13/21  4:37 AM   Specimen: Expectorated Sputum  Result Value Ref Range Status   Specimen Description EXPECTORATED SPUTUM  Final   Special Requests NONE  Final   Sputum evaluation   Final    THIS SPECIMEN IS ACCEPTABLE FOR SPUTUM CULTURE Performed at [facility], [address] [place], [place] [address]    Report Status 03/14/2021 FINAL  Final  Culture, Respiratory w Gram Stain     Status: None   Collection Time: 03/13/21  4:37 AM  Result Value Ref Range Status   Specimen Description EXPECTORATED SPUTUM  Final   Special Requests NONE Reflexed from [id number]  Final   Gram Stain   Final    RARE WBC PRESENT,BOTH PMN AND MONONUCLEAR RARE GRAM POSITIVE COCCI IN PAIRS RARE GRAM NEGATIVE RODS RARE GRAM POSITIVE RODS    Culture   Final    FEW GROUP A STREP (S.PYOGENES) ISOLATED Beta hemolytic streptococci are predictably susceptible to penicillin and other beta lactams. Susceptibility testing not routinely performed. Performed at [facility], [address], [place], [place] [address]    Report Status 03/16/2021 FINAL  Final  Gastrointestinal Panel by PCR , Stool     Status: None   Collection Time: 03/13/21  1:06 PM   Specimen: Stool  Result Value Ref Range Status   Campylobacter species NOT DETECTED NOT DETECTED Final   Plesimonas shigelloides NOT DETECTED NOT DETECTED Final   Salmonella species NOT DETECTED NOT DETECTED Final   Yersinia enterocolitica NOT DETECTED NOT DETECTED Final   Vibrio species NOT DETECTED NOT DETECTED Final   Vibrio cholerae NOT DETECTED NOT DETECTED Final   Enteroaggregative E coli (EAEC) NOT DETECTED NOT DETECTED Final   Enteropathogenic E coli (EPEC) NOT DETECTED NOT DETECTED Final   Enterotoxigenic E coli (ETEC) NOT DETECTED NOT DETECTED Final   Shiga like toxin producing E coli (STEC) NOT DETECTED NOT DETECTED Final   Shigella/Enteroinvasive E coli (EIEC) NOT DETECTED NOT  DETECTED Final   Cryptosporidium NOT DETECTED NOT DETECTED Final   Cyclospora cayetanensis NOT DETECTED NOT DETECTED Final   Entamoeba histolytica NOT DETECTED NOT DETECTED Final  Giardia lamblia NOT DETECTED NOT DETECTED Final   Adenovirus F40/41 NOT DETECTED NOT DETECTED Final   Astrovirus NOT DETECTED NOT DETECTED Final   Norovirus GI/GII NOT DETECTED NOT DETECTED Final   Rotavirus A NOT DETECTED NOT DETECTED Final   Sapovirus (I, II, IV, and V) NOT DETECTED NOT DETECTED Final    Comment: Performed at [facility], [address], [place], [place] [address]  C Difficile Quick Screen w PCR reflex     Status: None   Collection Time: 03/13/21  1:06 PM   Specimen: STOOL  Result Value Ref Range Status   C Diff antigen NEGATIVE NEGATIVE Final   C Diff toxin NEGATIVE NEGATIVE Final   C Diff interpretation No C. difficile detected.  Final    Comment: Performed at [facility], [address], [place], [place] [address]  Culture, blood (routine x 2)     Status: Abnormal   Collection Time: 03/13/21  2:00 PM   Specimen: BLOOD  Result Value Ref Range Status   Specimen Description BLOOD SITE NOT SPECIFIED  Final   Special Requests   Final    BOTTLES DRAWN AEROBIC AND ANAEROBIC Blood Culture results may not be optimal due to an inadequate volume of blood received in culture bottles   Culture  Setup Time   Final    GRAM POSITIVE COCCI IN BOTH AEROBIC AND ANAEROBIC BOTTLES CRITICAL VALUE NOTED.  VALUE IS CONSISTENT WITH PREVIOUSLY REPORTED AND CALLED VALUE.    Culture (A)  Final    GROUP A STREP (S.PYOGENES) ISOLATED SUSCEPTIBILITIES PERFORMED ON PREVIOUS CULTURE WITHIN THE LAST 5 DAYS. Performed at [facility], [address], [place], [place] [address]    Report Status 03/16/2021 FINAL  Final  Culture, blood (routine x 2)     Status: Abnormal   Collection Time: 03/13/21  2:36 PM   Specimen: BLOOD  Result Value Ref Range Status   Specimen Description BLOOD SITE  NOT SPECIFIED  Final   Special Requests   Final    BOTTLES DRAWN AEROBIC AND ANAEROBIC Blood Culture results may not be optimal due to an inadequate volume of blood received in culture bottles   Culture  Setup Time   Final    GRAM POSITIVE COCCI IN CHAINS IN BOTH AEROBIC AND ANAEROBIC BOTTLES CRITICAL RESULT CALLED TO, READ BACK BY AND VERIFIED WITH: Marissa Nestle [id number] FCP    Culture (A)  Final    GROUP A STREP (S.PYOGENES) ISOLATED HEALTH DEPARTMENT NOTIFIED Performed at [facility], [address], [place], [place] [address]    Report Status 03/16/2021 FINAL  Final   Organism ID, Bacteria GROUP A STREP (S.PYOGENES) ISOLATED  Final      Susceptibility   Group a strep (s.pyogenes) isolated - MIC*    PENICILLIN <=0.06 SENSITIVE Sensitive     CEFTRIAXONE <=0.12 SENSITIVE Sensitive     ERYTHROMYCIN <=0.12 SENSITIVE Sensitive     LEVOFLOXACIN <=0.25 SENSITIVE Sensitive     VANCOMYCIN <=0.12 SENSITIVE Sensitive     * GROUP A STREP (S.PYOGENES) ISOLATED  Blood Culture ID Panel (Reflexed)     Status: Abnormal   Collection Time: 03/13/21  2:36 PM  Result Value Ref Range Status   Enterococcus faecalis NOT DETECTED NOT DETECTED Final   Enterococcus Faecium NOT DETECTED NOT DETECTED Final   Listeria monocytogenes NOT DETECTED NOT DETECTED Final   Staphylococcus species NOT DETECTED NOT DETECTED Final   Staphylococcus aureus (BCID) NOT DETECTED NOT  DETECTED Final   Staphylococcus epidermidis NOT DETECTED NOT DETECTED Final   Staphylococcus lugdunensis NOT DETECTED NOT DETECTED Final   Streptococcus species DETECTED (A) NOT DETECTED Final    Comment: CRITICAL RESULT CALLED TO, READ BACK BY AND VERIFIED WITH: PHARMD THOMAS J [id number] FCP    Streptococcus agalactiae NOT DETECTED NOT DETECTED Final   Streptococcus pneumoniae NOT DETECTED NOT DETECTED Final   Streptococcus pyogenes DETECTED (A) NOT DETECTED Final    Comment: CRITICAL RESULT CALLED TO, READ BACK BY AND  VERIFIED WITH: Marissa Nestle [phone] [id number] FCP    A.calcoaceticus-baumannii NOT DETECTED NOT DETECTED Final   Bacteroides fragilis NOT DETECTED NOT DETECTED Final   Enterobacterales NOT DETECTED NOT DETECTED Final   Enterobacter cloacae complex NOT DETECTED NOT DETECTED Final   Escherichia coli NOT DETECTED NOT DETECTED Final   Klebsiella aerogenes NOT DETECTED NOT DETECTED Final   Klebsiella oxytoca NOT DETECTED NOT DETECTED Final   Klebsiella pneumoniae NOT DETECTED NOT DETECTED Final   Proteus species NOT DETECTED NOT DETECTED Final   Salmonella species NOT DETECTED NOT DETECTED Final   Serratia marcescens NOT DETECTED NOT DETECTED Final   Haemophilus influenzae NOT DETECTED NOT DETECTED Final   Neisseria meningitidis NOT DETECTED NOT DETECTED Final   Pseudomonas aeruginosa NOT DETECTED NOT DETECTED Final   Stenotrophomonas maltophilia NOT DETECTED NOT DETECTED Final   Candida albicans NOT DETECTED NOT DETECTED Final   Candida auris NOT DETECTED NOT DETECTED Final   Candida glabrata NOT DETECTED NOT DETECTED Final   Candida krusei NOT DETECTED NOT DETECTED Final   Candida parapsilosis NOT DETECTED NOT DETECTED Final   Candida tropicalis NOT DETECTED NOT DETECTED Final   Cryptococcus neoformans/gattii NOT DETECTED NOT DETECTED Final    Comment: Performed at [facility], [address], [place], [place] [address]  Resp Panel by RT-PCR (Flu A&B, Covid) Nasopharyngeal Swab     Status: None   Collection Time: 03/13/21  2:40 PM   Specimen: Nasopharyngeal Swab; Nasopharyngeal(NP) swabs in vial transport medium  Result Value Ref Range Status   SARS Coronavirus 2 by RT PCR NEGATIVE NEGATIVE Final    Comment: (NOTE) SARS-CoV-2 target nucleic acids are NOT DETECTED.  The SARS-CoV-2 RNA is generally detectable in upper respiratory specimens during the acute phase of infection. The lowest concentration of SARS-CoV-2 viral copies this assay can detect is 138 copies/mL. A negative  result does not preclude SARS-Cov-2 infection and should not be used as the sole basis for treatment or other patient management decisions. A negative result may occur with  improper specimen collection/handling, submission of specimen other than nasopharyngeal swab, presence of viral mutation(s) within the areas targeted by this assay, and inadequate number of viral copies(<138 copies/mL). A negative result must be combined with clinical observations, patient history, and epidemiological information. The expected result is Negative.  Fact Sheet for Patients:  BloggerCourse.com  Fact Sheet for Healthcare Providers:  SeriousBroker.it  This test is no t yet approved or cleared by the [place] FDA and  has been authorized for detection and/or diagnosis of SARS-CoV-2 by FDA under an Emergency Use Authorization (EUA). This EUA will remain  in effect (meaning this test can be used) for the duration of the COVID-19 declaration under Section 564(b)(1) of the Act, 21 U.S.C.section 360bbb-3(b)(1), unless the authorization is terminated  or revoked sooner.       Influenza A by PCR NEGATIVE NEGATIVE Final   Influenza B by PCR NEGATIVE NEGATIVE Final    Comment: (  NOTE) The Xpert Xpress SARS-CoV-2/FLU/RSV plus assay is intended as an aid in the diagnosis of influenza from Nasopharyngeal swab specimens and should not be used as a sole basis for treatment. Nasal washings and aspirates are unacceptable for Xpert Xpress SARS-CoV-2/FLU/RSV testing.  Fact Sheet for Patients: BloggerCourse.com  Fact Sheet for Healthcare Providers: SeriousBroker.it  This test is not yet approved or cleared by the [place] FDA and has been authorized for detection and/or diagnosis of SARS-CoV-2 by FDA under an Emergency Use Authorization (EUA). This EUA will remain in effect (meaning this test can be used)  for the duration of the COVID-19 declaration under Section 564(b)(1) of the Act, 21 U.S.C. section 360bbb-3(b)(1), unless the authorization is terminated or revoked.  Performed at [facility], [address], [place], [place] [address]   Culture, blood (routine x 2)     Status: None (Preliminary result)   Collection Time: 03/17/21 12:10 PM   Specimen: BLOOD  Result Value Ref Range Status   Specimen Description BLOOD RIGHT ANTECUBITAL  Final   Special Requests   Final    BOTTLES DRAWN AEROBIC AND ANAEROBIC Blood Culture adequate volume   Culture   Final    NO GROWTH 2 DAYS Performed at [facility], [address], [place], [place] [address]    Report Status PENDING  Incomplete  Culture, blood (routine x 2)     Status: None (Preliminary result)   Collection Time: 03/17/21 12:24 PM   Specimen: BLOOD  Result Value Ref Range Status   Specimen Description BLOOD RIGHT ANTECUBITAL  Final   Special Requests   Final    BOTTLES DRAWN AEROBIC ONLY Blood Culture adequate volume   Culture   Final    NO GROWTH 2 DAYS Performed at [facility], [address], [place], [place] [address]    Report Status PENDING  Incomplete    Acey Lav, MD [facility] [phone] pager  03/19/2021, 4:20 PM

## 2021-03-19 NOTE — Progress Notes (Signed)
Psychitry Progress Note  03/19/2021 12:28 PM Amy Murray  MRN:  [id number]   Subjective: No acute overnight events.  Patient evaluated at bedside this morning.  Patient denies any suicidal or homicidal ideations.  She denies any auditory and visual hallucinations.  She states she feels much closer to her baseline and is just eager to leave the hospital now and start his life.  She states overall her health has improved but she will wait for her medical team to make that decision for her.  She talks about her 30-year old daughter today and states that now that she feels better, understands that her daughter needs her.  Principal Problem: Sepsis (HCC) Diagnosis: Principal Problem:   Sepsis (HCC) Active Problems:   Bipolar I disorder (HCC)   PTSD (post-traumatic stress disorder)   Attention deficit hyperactivity disorder (ADHD), predominantly hyperactive type  Total Time spent with patient: 20 minutes  Past Psychiatric History: Bipolar disorder, ADHD, PTSD  Past Medical History: History reviewed. No pertinent past medical history. History reviewed. No pertinent surgical history. Family History: History reviewed. No pertinent family history. Family Psychiatric  History: Patient's father and brother has bipolar disorder.  Father has marijuana use disorder, cocaine use disorder, alcohol use disorder and many other family members on both side has polysubstance abuse disorder.  Social History:  Social History   Substance and Sexual Activity  Alcohol Use Not Currently     Social History   Substance and Sexual Activity  Drug Use Not Currently    Social History   Socioeconomic History  . Marital status: Single    Spouse name: Not on file  . Number of children: Not on file  . Years of education: Not on file  . Highest education level: Not on file  Occupational History  . Not on file  Tobacco Use  . Smoking status: Never Smoker  . Smokeless tobacco: Never Used  Substance and Sexual  Activity  . Alcohol use: Not Currently  . Drug use: Not Currently  . Sexual activity: Not on file  Other Topics Concern  . Not on file  Social History Narrative  . Not on file   Social Determinants of Health   Financial Resource Strain: Not on file  Food Insecurity: Not on file  Transportation Needs: Not on file  Physical Activity: Not on file  Stress: Not on file  Social Connections: Not on file   Additional Social History: Patient lives with a roommate named Claudette Stapler and works at [employer].  Sleep: Good  Appetite:  Fair  Current Medications: Current Facility-Administered Medications  Medication Dose Route Frequency Provider Last Rate Last Admin  . acetaminophen (TYLENOL) tablet 650 mg  650 mg Oral Q6H PRN Roylene Reason, MD      . divalproex (DEPAKOTE ER) 24 hr tablet 500 mg  500 mg Oral BID Dagar, Geralynn Rile, MD   500 mg at 03/19/21 7829  . enoxaparin (LOVENOX) injection 40 mg  40 mg Subcutaneous Q24H Albertha Ghee, MD   40 mg at 03/18/21 1522  . feeding supplement (ENSURE ENLIVE / ENSURE PLUS) liquid 237 mL  237 mL Oral TID BM Miguel Aschoff, MD   237 mL at 03/19/21 [phone]  . multivitamin with minerals tablet 1 tablet  1 tablet Oral Daily Miguel Aschoff, MD   1 tablet at 03/19/21 [phone]  . ondansetron (ZOFRAN) injection 4 mg  4 mg Intravenous Q6H PRN Merrilyn Puma, MD      . ondansetron [facility]) tablet 4  mg  4 mg Oral Q6H PRN Roylene Reason, MD      . penicillin G potassium 12 Million Units in dextrose 5 % 500 mL continuous infusion  12 Million Units Intravenous Q12H Tamera Reason, RPH 41.7 mL/hr at 03/19/21 1137 12 Million Units at 03/19/21 1137  . traZODone (DESYREL) tablet 100 mg  100 mg Oral QHS PRN Dagar, Geralynn Rile, MD   100 mg at 03/18/21 2106    Lab Results:  CBC Latest Ref Rng & Units 03/19/2021 03/18/2021 03/17/2021  WBC 4.0 - 10.5 K/uL 19.4(H) 23.8(H) 21.3(H)  Hemoglobin 12.0 - 15.0 g/dL 9.4(W) 9.6(P) 5.9(F)  Hematocrit 36.0 - 46.0 % 24.8(L)  23.8(L) 24.6(L)  Platelets 150 - 400 K/uL 383 237 200   BMP Latest Ref Rng & Units 03/19/2021 03/18/2021 03/17/2021  Glucose 70 - 99 mg/dL 99 93 638(G)  BUN 6 - 20 mg/dL 5(L) 7 11  Creatinine 6.65 - 1.00 mg/dL 9.93 5.70 1.77  Sodium 135 - 145 mmol/L 134(L) 135 134(L)  Potassium 3.5 - 5.1 mmol/L 3.9 3.5 3.5  Chloride 98 - 111 mmol/L 99 98 97(L)  CO2 22 - 32 mmol/L 26 27 27   Calcium 8.9 - 10.3 mg/dL 7.9(L) 7.9(L) 7.8(L)    Blood Alcohol level:  No results found for: [facility]  Metabolic Disorder Labs: Lab Results  Component Value Date   HGBA1C 5.8 (H) 03/16/2021   MPG 119.76 03/16/2021   No results found for: PROLACTIN Lab Results  Component Value Date   CHOL 182 03/16/2021   TRIG 317 (H) 03/16/2021   HDL <10 (L) 03/16/2021   CHOLHDL NOT CALCULATED 03/16/2021   VLDL 63 (H) 03/16/2021   LDLCALC NOT CALCULATED 03/16/2021   Musculoskeletal: Strength & Muscle Tone: within normal limits Gait & Station: unsteady Patient leans: N/A  Psychiatric Specialty Exam: Physical Exam Vitals and nursing note reviewed.  Constitutional:      Appearance: She is well-developed.  HENT:     Head: Normocephalic and atraumatic.  Cardiovascular:     Rate and Rhythm: Normal rate and regular rhythm.  Pulmonary:     Effort: Pulmonary effort is normal.  Neurological:     Mental Status: She is alert and oriented to person, place, and time.     Review of Systems  Blood pressure 115/71, pulse 99, temperature 99.2 F (37.3 C), temperature source Oral, resp. rate 16, height 5' (1.524 m), weight 72.3 kg, last menstrual period 03/06/2021, SpO2 99 %.Body mass index is 31.13 kg/m.  General Appearance: Casual  Eye Contact:  Good  Speech:  Pressured  Volume:  Normal  Mood:  Good  Affect:  Appropriate  Thought Process:  Coherent and Descriptions of Associations: Intact  Orientation:  Full (Time, Place, and Person)  Thought Content:  Logical  Suicidal Thoughts:  No  Homicidal Thoughts:  No  Memory:   Immediate;   Good Recent;   Fair Remote;   Fair  Judgement:  Good  Insight:  Good  Psychomotor Activity:  Normal  Concentration:  Concentration: Good and Attention Span: Good  Recall:  Fair  Fund of Knowledge:  Good  Language:  Good  Akathisia:  No  Handed:  Right  AIMS (if indicated):     Assets:  Communication Skills Desire for Improvement Resilience Social Support Vocational/Educational  ADL's:  Intact  Cognition:  WNL  Sleep:      Assessment: 30 year old female with past psychiatric history of bipolar disorder, PTSD and ADHD who was admitted for sepsis which is resolving now. #Bipolar  disorder -On evaluation today patient denies any suicidal or homicidal ideations.  She does not seem like responding to internal stimuli.  Her concentration and attention has improved significantly and she was able to have a normal conversation today.  She still has pressured speech but overall she has improved significantly.  She has fair memory.  She was calm and cooperative during her whole evaluation and noted that Depakote is helping her stabilizing her mood and she can sleep well at night and her mind is not racing anymore. -Her valproic acid level on 03/19/2021 is 47. -Patient is aware of her psychiatric diagnosis and understands the need to be compliant to her medications.  She understands that being of medication impacted her mental health significantly and does not plan to repeat it again.  She is not an imminent risk to self or others.  She does not meet criteria for inpatient psychiatric hospitalization.  I have ordered Depakote level for tomorrow and CMP to check her AST/ALT and would reevaluate her tomorrow again.  Treatment Plan: --Continue Depakote 500 mg ER twice daily for mood stabilization --Continue trazodone 100 mg nightly as needed for sleep --Discontinue sertraline and hydralazine --Follow-up Depakote level, CMP tomorrow morning --Daily contact with patient to assess and  evaluate symptoms and progress in treatment. --Psychiatry will continue to follow  Arnoldo Lenis, MD 03/19/2021, 12:28 PM  PGY-1, Resident

## 2021-03-19 NOTE — Progress Notes (Addendum)
Subjective:  No acute overnight events.   This morning, patient is feeling well. Notes improved mobility of both arms and hands. She still has pain of the right foot and ankle that is worse with weight-bearing and partially relieved with ice application and use of an ACE wrap. Notes only being able to walk a few steps because of the pain. Also reports continued pain of the right shoulder with limited ROM. She is able to use her left shoulder without pain or limited range of motion.   ROS:  (-): fever, abdominal pain, diarrhea, nausea, vomiting   Objective:  Vital signs in last 24 hours: Vitals:   03/18/21 1524 03/18/21 2043 03/18/21 2349 03/19/21 0500  BP: 112/71 117/72 115/71   Pulse: 98 95 99   Resp: 17 16 16    Temp: 98.8 F (37.1 C) 98.6 F (37 C) 99.2 F (37.3 C)   TempSrc:  Oral Oral   SpO2: 100% 100% 99% on RA   Weight:    72.3 kg   Filed Weights   03/15/21 0349 03/19/21 0500  Weight: 67.9 kg 72.3 kg   Physical Exam:  General: Young woman; alert and conversational; sitting on chair next to hospital bed, listening to music Cardiovascular: Regular rate, rhythm; no murmurs, rubs, gallops; 2+ extremities, all pulses  Lungs: Normal pulmonary effort; clear to auscultation, bilaterally  MSK: No edema, cyanosis, erythema; limited ROM to pain, RUE; moderate warmth of right shoulder; normal ROM, LUE; point tenderness, right shoulder; non-tender left shoulder; limited ROM to pain, right ankle; normal ROM, left ankle; tenderness, right foot Psychiatric: Normal attention, elated mood; rapid speech; cooperative during exam    No I/O Data (last 24 hours)  Labs:  BMP Latest Ref Rng & Units 03/19/2021 03/18/2021 03/17/2021  Glucose 70 - 99 mg/dL 99 93 03/19/2021)  BUN 6 - 20 mg/dL 5(L) 7 11  Creatinine 376(E - 1.00 mg/dL 8.31 5.17 6.16  Sodium 135 - 145 mmol/L 134(L) 135 134(L)  Potassium 3.5 - 5.1 mmol/L 3.9 3.5 3.5  Chloride 98 - 111 mmol/L 99 98 97(L)  CO2 22 - 32 mmol/L 26 27 27    Calcium 8.9 - 10.3 mg/dL 7.9(L) 7.9(L) 7.8(L)   CBC Latest Ref Rng & Units 03/19/2021 03/18/2021 03/17/2021  WBC 4.0 - 10.5 K/uL 19.4(H) 23.8(H) 21.3(H)  Hemoglobin 12.0 - 15.0 g/dL 03/20/2021) 03/19/2021) 7.1(G)  Hematocrit 36.0 - 46.0 % 24.8(L) 23.8(L) 24.6(L)  Platelets 150 - 400 K/uL 383 237 200  MCV: 74.5 RDW: 16.1  Hgb Fractionation - pending   Blood culture (repeat) - no growth after 2 hours, still pending   Imaging in Last 24 Hours:   ECHOCARDIOGRAM: 1. No obvious vegetation. Exam is not adequate to r/o however. Would require TEE if clinically indicated.  2. Left ventricular ejection fraction, by estimation, is 65 to 70%. The left ventricle has normal function. The left ventricle has no regional wall motion abnormalities. Left ventricular diastolic parameters were normal.  3. Right ventricular systolic function is normal. The right ventricular size is normal.  4. The mitral valve is normal in structure. Mild mitral valve regurgitation.  5. The aortic valve is normal in structure. Aortic valve regurgitation is not visualized.   Assessment/Plan:  Principal Problem:   Sepsis (HCC) Active Problems:   Bipolar I disorder (HCC)   PTSD (post-traumatic stress disorder)   Attention deficit hyperactivity disorder (ADHD), predominantly hyperactive type  In summary, this is hospital day #6 for Amy Murray, a 30 year old womanwith a past medical history  of bipolar disorder,PTSD, andADHDwho presented for evaluation of abdominal pain, nausea and vomiting. Found to have sepsissecondary to streptococcus pyogenesbacteremia of unknown source.  #Streptococcus pyogenes bacteremia, active Patient's has improved symptomatically -- no longer has abdominal pain, diarrhea, n/v. She remains hemodynamically stable and afebrile with a Tmax of 99.2 in past 24 hours without acetaminophen administration. She is on day 7 of IV antibiotics (one day of metronidazole and ceftriaxone, 6 days of penicillin G). This  morning, patients WBC count decreased to 19.4, a reversal in upward trend since admission. This is a positive development, suggesting possibly resolving infection. However, elevated WBC count is still concerning for poor source control in setting of unknown etiology of bacteremia. Repeat blood cultures demonstrate no growth after two days, results pending at this time. Echocardiogram was unremarkable without signs of vegetation. No signs of pharyngitis or pneumonia (no cough, sore throat, fever). Appreciate I/D given lack of source identification. I/D suggests MRI for evaluation of septic joints given patient continues to have decreased ROM, most notably at right shoulder.  Plan: - f/u blood cultures x2 - f/u MRI, right shoulder   - Continue IV Penicillin 12 million units Q12H (day6) - Continue daily CBC for monitoring leukocytosis - Monitor fever curve, acetaminophen 650mg  Q6H PRN  #Bipolar disorder, chronic #PTSD, chronic #ADHD, chronic Patient's mood and behavior continue to be at baseline. She appears motivated to get better in order to leave the hospital. No confusion noted during today's encounter. Mood remains elevated and speech is still somewhat pressured, rapid. We greatly appreciate continued recommendations from our psychiatry colleagues, who will continue to follow patient. Psychiatry does not think patient meets criteria for inpatient management. They suggest outpatient follow-up at [facility] given her lack of insurance -- this clinic accepts walk-in appointments.  Plan: - Psychiatry following, greatly appreciate recommendations - [facility] Depakote ER 500mg  twice daily - [facility] trazodone 100mg  at bedtime PRN - Hold sertraline 50mg  daily for now - Hold hydroxyzine 25mg  Q8H PRN for now - f/u TOC to establish outpatient PCP and outpatient psychiatry prior to discharge   #Microcytic anemia, stable Patient's hemoglobin is stable at 8.4.  Laboratory results (high ferritin, low TIBC, low iron) suggest patient has microcytic anemia secondary to an inflammatory response in setting of ongoing infection. Low reticulocyte count is also consistent with this diagnosis. Beta-thalassemia, as noted in pathology review of blood smear, may also be contributing to patient's microcytic anemia. Unlikely macrocytic component in setting of normal folate with elevated B12.  Plan:  - f/u Hgb electrophoresis to assess for thalassemia  - Daily CBC, monitor hemoglobin   #Hypokalemia, resolved Patient is normokalemic during today's encounter with a potassium of 3.9. Has been normokalemic for a few days now.    Plan: - Daily BMP to monitor electrolytes  - Continue to replete orally if needed (although nursing reports that patient has not been taking oral supplementation regularly)  #Hypovolemic Hyponatremia, resolved Patient presented with hyponatremia (123) in the setting of hypovolemia secondary to poor oral intake, diarrhea and vomiting which has since improved to 135 most recently.  #AKI, resolved Creatinine 1.61 on admission with continued trend down most recently to 0.54. AKI on presentation likely pre-renal in the setting of diarrhea, vomiting and poor oral intake.    LOS: 6 days   [facility], Medical Student 03/19/2021, 5:31 PM

## 2021-03-19 NOTE — Progress Notes (Signed)
Physical Therapy Treatment Patient Details Name: Amy Murray MRN: [id number] DOB: 12/16/1991 Today's Date: 03/19/2021    History of Present Illness Amy Murray is a 30 y.o. woman with a past medical history of bipolar disorder, PTSD, ADHD who presents with abdominal pain. Patient states that she was very cold Friday night after getting her hair done, smoking marijuana with her friend, and eating [employer]. On Saturday morning, she woke up feeling weak and had diffuse abdominal pain that was worse on her right-side and flank. She also began to experience several episodes of "explosive", non-bloody diarrhea, which has been intermittently brown and yellow in color. Notes associated nausea and bilious vomiting, which has been worse when trying to eat or drink. Patient also reports pain with urination and darkened urine. Abdominal pain worsened Monday and patient was evaluated at urgent care. Given Zofran and IVF for nausea at that time without relief. Presented to ED today after developing a fever and productive cough in setting of worsening abdominal pain, nausea, vomiting, and weakness. Cough is now productive of green sputum with streaks of blood.    PT Comments    Pt participatory and appropriate with session. Working to set up her leave from work at this time. Agreed to PT and then will call them back. Limited by right foot pain in standing and with gait. Did well with use of RW to offload right foot. Acute PT to continue during pt's hospital stay.    Follow Up Recommendations  Home health PT;Other (comment)     Equipment Recommendations  Rolling walker with 5" wheels    Precautions / Restrictions Precautions Precautions: Fall Restrictions Weight Bearing Restrictions: No Other Position/Activity Restrictions: pt does not bear weight on right foot due to pain    Mobility  Bed Mobility               General bed mobility comments: OOB in chair before and after session     Transfers Overall transfer level: Needs assistance Equipment used: Rolling walker (2 wheeled) Transfers: Sit to/from Stand Sit to Stand: Min guard         General transfer comment: cues needed for hand placement as pt wanted to pull up on RW to stand  Ambulation/Gait Ambulation/Gait assistance: Min guard Gait Distance (Feet): 10 Feet Assistive device: Rolling walker (2 wheeled) Gait Pattern/deviations: Step-to pattern;Decreased stance time - right;Decreased weight shift to right;Decreased step length - left;Antalgic Gait velocity: decr   General Gait Details: min guard assist for safety with no balance issues noted. decreased stance on right foot due to pt reported pain with weight bearing       Cognition Arousal/Alertness: Awake/alert   Overall Cognitive Status: Within Functional Limits for tasks assessed                    Pertinent Vitals/Pain Pain Assessment: 0-10 Pain Score: 10-Worst pain ever Pain Location: R foot, hand Pain Descriptors / Indicators: Discomfort;Sore;Grimacing;Guarding Pain Intervention(s): Limited activity within patient's tolerance;Monitored during session;Premedicated before session;Repositioned     PT Goals (current goals can now be found in the care plan section) Acute Rehab PT Goals Patient Stated Goal: none stated PT Goal Formulation: Patient unable to participate in goal setting Time For Goal Achievement: 03/23/21 Progress towards PT goals: Progressing toward goals    Frequency    Min 2X/week      PT Plan Current plan remains appropriate    AM-PAC PT "6 Clicks" Mobility   Outcome Measure  Help needed turning  from your back to your side while in a flat bed without using bedrails?: A Little Help needed moving from lying on your back to sitting on the side of a flat bed without using bedrails?: A Little Help needed moving to and from a bed to a chair (including a wheelchair)?: A Little Help needed standing up from a  chair using your arms (e.g., wheelchair or bedside chair)?: A Little Help needed to walk in hospital room?: A Little Help needed climbing 3-5 steps with a railing? : A Lot 6 Click Score: 17    End of Session Equipment Utilized During Treatment: Gait belt Activity Tolerance: Patient limited by pain Patient left: in chair;with call bell/phone within reach Nurse Communication: Mobility status PT Visit Diagnosis: Unsteadiness on feet (R26.81);Difficulty in walking, not elsewhere classified (R26.2);Pain Pain - Right/Left: Right Pain - part of body: Hand (and foot)     Time: 7939-0300 PT Time Calculation (min) (ACUTE ONLY): 12 min  Charges:  $Gait Training: 8-22 mins            Sallyanne Kuster, PTA, CLT Acute [employer] [phone] 03/19/21, 11:50 AM  Sallyanne Kuster 03/19/2021, 11:48 AM

## 2021-03-19 NOTE — Progress Notes (Signed)
  Echocardiogram 2D Echocardiogram has been performed.  Delcie Roch 03/19/2021, 9:14 AM

## 2021-03-20 ENCOUNTER — Inpatient Hospital Stay (HOSPITAL_COMMUNITY): Payer: Self-pay

## 2021-03-20 DIAGNOSIS — F901 Attention-deficit hyperactivity disorder, predominantly hyperactive type: Secondary | ICD-10-CM

## 2021-03-20 DIAGNOSIS — M009 Pyogenic arthritis, unspecified: Secondary | ICD-10-CM

## 2021-03-20 DIAGNOSIS — K752 Nonspecific reactive hepatitis: Secondary | ICD-10-CM

## 2021-03-20 LAB — CBC WITH DIFFERENTIAL/PLATELET
Abs Immature Granulocytes: 1.55 10*3/uL — ABNORMAL HIGH (ref 0.00–0.07)
Basophils Absolute: 0 10*3/uL (ref 0.0–0.1)
Basophils Relative: 0 %
Eosinophils Absolute: 0.1 10*3/uL (ref 0.0–0.5)
Eosinophils Relative: 1 %
HCT: 26.4 % — ABNORMAL LOW (ref 36.0–46.0)
Hemoglobin: 8.7 g/dL — ABNORMAL LOW (ref 12.0–15.0)
Immature Granulocytes: 9 %
Lymphocytes Relative: 24 %
Lymphs Abs: 3.9 10*3/uL (ref 0.7–4.0)
MCH: 25.1 pg — ABNORMAL LOW (ref 26.0–34.0)
MCHC: 33 g/dL (ref 30.0–36.0)
MCV: 76.1 fL — ABNORMAL LOW (ref 80.0–100.0)
Monocytes Absolute: 1.5 10*3/uL — ABNORMAL HIGH (ref 0.1–1.0)
Monocytes Relative: 9 %
Neutro Abs: 9.4 10*3/uL — ABNORMAL HIGH (ref 1.7–7.7)
Neutrophils Relative %: 57 %
Platelets: 369 10*3/uL (ref 150–400)
RBC: 3.47 MIL/uL — ABNORMAL LOW (ref 3.87–5.11)
RDW: 17.1 % — ABNORMAL HIGH (ref 11.5–15.5)
WBC: 16.4 10*3/uL — ABNORMAL HIGH (ref 4.0–10.5)
nRBC: 0 % (ref 0.0–0.2)

## 2021-03-20 LAB — COMPREHENSIVE METABOLIC PANEL
ALT: 30 U/L (ref 0–44)
AST: 32 U/L (ref 15–41)
Albumin: 2.1 g/dL — ABNORMAL LOW (ref 3.5–5.0)
Alkaline Phosphatase: 118 U/L (ref 38–126)
Anion gap: 7 (ref 5–15)
BUN: 5 mg/dL — ABNORMAL LOW (ref 6–20)
CO2: 27 mmol/L (ref 22–32)
Calcium: 8.3 mg/dL — ABNORMAL LOW (ref 8.9–10.3)
Chloride: 101 mmol/L (ref 98–111)
Creatinine, Ser: 0.57 mg/dL (ref 0.44–1.00)
GFR, Estimated: >60 mL/min (ref >60)
Glucose, Bld: 91 mg/dL (ref 70–99)
Potassium: 4.6 mmol/L (ref 3.5–5.1)
Sodium: 135 mmol/L (ref 135–145)
Total Bilirubin: 0.5 mg/dL (ref 0.3–1.2)
Total Protein: 7 g/dL (ref 6.5–8.1)

## 2021-03-20 LAB — HGB FRACTIONATION CASCADE
Hgb A2: 2.5 % (ref 1.8–3.2)
Hgb A: 97.5 % (ref 96.4–98.8)
Hgb F: 0 % (ref 0.0–2.0)
Hgb S: 0 %

## 2021-03-20 LAB — SEDIMENTATION RATE: Sed Rate: 110 mm/hr — ABNORMAL HIGH (ref 0–22)

## 2021-03-20 LAB — VALPROIC ACID LEVEL: Valproic Acid Lvl: 59 ug/mL (ref 50.0–100.0)

## 2021-03-20 LAB — C-REACTIVE PROTEIN: CRP: 9.2 mg/dL — ABNORMAL HIGH (ref <1.0)

## 2021-03-20 LAB — HEPATITIS C ANTIBODY: HCV Ab: REACTIVE — AB

## 2021-03-20 MED ORDER — GADOBUTROL 1 MMOL/ML IV SOLN
7.0000 mL | Freq: Once | INTRAVENOUS | Status: AC | PRN
  Administered 2021-03-20: 7 mL via INTRAVENOUS

## 2021-03-20 MED ORDER — ACETAMINOPHEN 325 MG PO TABS: 650.0000 mg | ORAL_TABLET | Freq: Four times a day (QID) | ORAL | Status: DC | PRN

## 2021-03-20 MED ORDER — NAPROXEN 250 MG PO TABS
500.0000 mg | ORAL_TABLET | Freq: Two times a day (BID) | ORAL | Status: DC
  Administered 2021-03-20 – 2021-03-21 (×2): 500 mg via ORAL
  Filled 2021-03-20 (×3): qty 2

## 2021-03-20 MED ORDER — IBUPROFEN 600 MG PO TABS: 800.0000 mg | ORAL_TABLET | Freq: Four times a day (QID) | ORAL | Status: DC | PRN

## 2021-03-20 NOTE — Progress Notes (Signed)
OT Cancellation Note  Patient Details Name: Amy Murray MRN: [id number] DOB: [birth year]   Cancelled Treatment:    Reason Eval/Treat Not Completed: Patient declined, no reason specified (Pt stating that she has just finally returned to bed after her MRI and needs her R foot to be elevated. Pt has declined OT session  reporting "when my leg is elevated with ice, it feels better and I can walk." Pt education for OT visit for RW vs W/C.)   Flora Lipps, OTR/L Acute Rehabilitation Services Pager: [phone] Office: [phone]  Iliani Vejar C 03/20/2021, 10:49 AM

## 2021-03-20 NOTE — TOC Benefit Eligibility Note (Signed)
Patient Advocate Encounter    Was successful in obtaining a [employer] copay card.  This copay card will make the patients copay $15.00 for 6 tablets.  RxBin: [id number] PCN: Loyalty Member ID: [id number] Group ID: [id number]    Roland Earl, CPhT Certified Pharmacy Technician- Transitions of Care [facility] Antimicrobial Stewardship Team Direct Number: [phone]  Fax: [phone]

## 2021-03-20 NOTE — TOC Progression Note (Signed)
Transition of Care [facility]) - Progression Note    Patient Details  Name: Amy Murray MRN: [id number] Date of Birth: Jan 31, 1991  Transition of Care [facility]) CM/SW Contact  Joanne Chars, LCSW Phone Number: 03/20/2021, 9:24 AM  Clinical Narrative:   MD request to attempt sooner PCP appt.  [facility] first appt I August.  [facility] able to see pt 4/7 and this was scheduled.  Behavioral Health outpt appt at [facility] is on walk in basis.  CSW attempted to call but unable to reach anyone.  CSW met with pt regarding DME recommendations--pt asked for wheelchair and 3n1.  (PT and OT making different recommendations-attempting to clarify.)    Expected Discharge Plan: Home/Self Care Barriers to Discharge: Continued Medical Work up  Expected Discharge Plan and Services Expected Discharge Plan: Home/Self Care In-house Referral: Clinical Social Work   Post Acute Care Choice: NA Living arrangements for the past 2 months: Single Family Home                                       Social Determinants of Health (SDOH) Interventions    Readmission Risk Interventions No flowsheet data found.

## 2021-03-20 NOTE — Progress Notes (Signed)
Psychitry Progress Note  03/20/2021 2:01 PM Amy Murray  MRN:  [id number]   Subjective: Patient refused to take her Depakote and Lovenox last night. Patient evaluated at bedside this morning, no family present in the room.  She denies any suicidal or homicidal ideations.  She denies any auditory or visual hallucinations and states her mood and speech is under her control now.  She states she did not decline her Depakote last night but asked her nurse to come back little bit later with medications and they never gave it to her after that.  She states " I am very much aware that I need to take my psychiatric medications to get better and stay better but nurse did not come again to confirm my medication and that is why I missed it".  She denies any new complaints and states she feels close to her baseline.  Principal Problem: Sepsis (HCC) Diagnosis: Principal Problem:   Sepsis (HCC) Active Problems:   Bipolar I disorder (HCC)   PTSD (post-traumatic stress disorder)   Attention deficit hyperactivity disorder (ADHD), predominantly hyperactive type  Total Time spent with patient: 25 minutes  Past Psychiatric History: Bipolar disorder, ADHD, PTSD  Past Medical History: History reviewed. No pertinent past medical history. History reviewed. No pertinent surgical history. Family History: History reviewed. No pertinent family history. Family Psychiatric  History: Patient's father and brother has bipolar disorder.  Father has marijuana use disorder, cocaine use disorder, alcohol use disorder and many other family members on both side has polysubstance abuse disorder.  Social History:  Social History   Substance and Sexual Activity  Alcohol Use Not Currently     Social History   Substance and Sexual Activity  Drug Use Not Currently    Social History   Socioeconomic History  . Marital status: Single    Spouse name: Not on file  . Number of children: Not on file  . Years of education: Not  on file  . Highest education level: Not on file  Occupational History  . Not on file  Tobacco Use  . Smoking status: Never Smoker  . Smokeless tobacco: Never Used  Substance and Sexual Activity  . Alcohol use: Not Currently  . Drug use: Not Currently  . Sexual activity: Not on file  Other Topics Concern  . Not on file  Social History Narrative  . Not on file   Social Determinants of Health   Financial Resource Strain: Not on file  Food Insecurity: Not on file  Transportation Needs: Not on file  Physical Activity: Not on file  Stress: Not on file  Social Connections: Not on file   Additional Social History: Patient lives with a roommate named Claudette Stapler and works at [employer].  Sleep: Good  Appetite:  Fair  Current Medications: Current Facility-Administered Medications  Medication Dose Route Frequency Provider Last Rate Last Admin  . acetaminophen (TYLENOL) tablet 650 mg  650 mg Oral Q6H PRN Roylene Reason, MD      . diclofenac Sodium (VOLTAREN) 1 % topical gel 4 g  4 g Topical QID PRN Roylene Reason, MD   4 g at 03/20/21 4656  . divalproex (DEPAKOTE ER) 24 hr tablet 500 mg  500 mg Oral BID Dagar, Geralynn Rile, MD   500 mg at 03/20/21 0928  . enoxaparin (LOVENOX) injection 40 mg  40 mg Subcutaneous Q24H Albertha Ghee, MD   40 mg at 03/18/21 1522  . feeding supplement (ENSURE ENLIVE / ENSURE PLUS) liquid 237 mL  237 mL Oral TID BM Miguel Aschoff, MD   237 mL at 03/20/21 [phone]  . multivitamin with minerals tablet 1 tablet  1 tablet Oral Daily Miguel Aschoff, MD   1 tablet at 03/20/21 [phone]  . penicillin G potassium 12 Million Units in dextrose 5 % 500 mL continuous infusion  12 Million Units Intravenous Q12H Tamera Reason, RPH 41.7 mL/hr at 03/20/21 1121 12 Million Units at 03/20/21 1121  . traZODone (DESYREL) tablet 100 mg  100 mg Oral QHS PRN Dagar, Geralynn Rile, MD   100 mg at 03/18/21 2106    Lab Results:  CBC Latest Ref Rng & Units 03/20/2021 03/19/2021  03/18/2021  WBC 4.0 - 10.5 K/uL 16.4(H) 19.4(H) 23.8(H)  Hemoglobin 12.0 - 15.0 g/dL 2.0(N) 4.7(S) 9.6(G)  Hematocrit 36.0 - 46.0 % 26.4(L) 24.8(L) 23.8(L)  Platelets 150 - 400 K/uL 369 383 237   BMP Latest Ref Rng & Units 03/20/2021 03/19/2021 03/18/2021  Glucose 70 - 99 mg/dL 91 99 93  BUN 6 - 20 mg/dL 5(L) 5(L) 7  Creatinine 0.44 - 1.00 mg/dL 8.36 6.29 4.76  Sodium 135 - 145 mmol/L 135 134(L) 135  Potassium 3.5 - 5.1 mmol/L 4.6 3.9 3.5  Chloride 98 - 111 mmol/L 101 99 98  CO2 22 - 32 mmol/L 27 26 27   Calcium 8.9 - 10.3 mg/dL 8.3(L) 7.9(L) 7.9(L)    Blood Alcohol level:  No results found for: [facility]  Metabolic Disorder Labs: Lab Results  Component Value Date   HGBA1C 5.8 (H) 03/16/2021   MPG 119.76 03/16/2021   No results found for: PROLACTIN Lab Results  Component Value Date   CHOL 182 03/16/2021   TRIG 317 (H) 03/16/2021   HDL <10 (L) 03/16/2021   CHOLHDL NOT CALCULATED 03/16/2021   VLDL 63 (H) 03/16/2021   LDLCALC NOT CALCULATED 03/16/2021   Musculoskeletal: Strength & Muscle Tone: within normal limits Gait & Station: unsteady Patient leans: N/A  Psychiatric Specialty Exam: Physical Exam Vitals and nursing note reviewed.  Constitutional:      Appearance: She is well-developed.  HENT:     Head: Normocephalic and atraumatic.  Cardiovascular:     Rate and Rhythm: Normal rate and regular rhythm.  Pulmonary:     Effort: Pulmonary effort is normal.  Neurological:     Mental Status: She is alert and oriented to person, place, and time.     Review of Systems  Blood pressure 115/74, pulse 91, temperature 98 F (36.7 C), temperature source Oral, resp. rate 16, height 5' (1.524 m), weight 72.3 kg, last menstrual period 03/06/2021, SpO2 100 %.Body mass index is 31.13 kg/m.  General Appearance: Casual  Eye Contact:  Good  Speech:  Pressured  Volume:  Normal  Mood:  Good  Affect:  Appropriate  Thought Process:  Coherent and Descriptions of Associations: Intact   Orientation:  Full (Time, Place, and Person)  Thought Content:  Logical  Suicidal Thoughts:  No  Homicidal Thoughts:  No  Memory:  Immediate;   Good Recent;   Fair Remote;   Fair  Judgement:  Good  Insight:  Good  Psychomotor Activity:  Normal  Concentration:  Concentration: Good and Attention Span: Good  Recall:  Fair  Fund of Knowledge:  Good  Language:  Good  Akathisia:  No  Handed:  Right  AIMS (if indicated):     Assets:  Communication Skills Desire for Improvement Resilience Social Support Vocational/Educational  ADL's:  Intact  Cognition:  WNL  Sleep:  Assessment: 30 year old female with past psychiatric history of bipolar disorder, PTSD and ADHD who was admitted for sepsis which is resolving now. #Bipolar disorder -On evaluation today patient denies any suicidal ideations or homicidal ideations.  He is not seen responding to any internal stimuli.  Concentration and attention are good and able to have full conversation without any difficulty.  She has mildly pressured speech but overall she has improved significantly.  Calm and cooperative during whole evaluation but missed her Depakote last night and feels guilty about it.  Patient is explained in detail again the need for her to take her medication, she was tearful and showed good understanding and insight about her psychiatric diagnoses and medication management. -Depakote level on 03/20/2021 is 59, AST 32, ALT 30, platelets 369.  Patient is hepatitis C reactive.  Patient mentions today she is pansexual.  Treatment Plan: --Continue Depakote 500 mg ER twice daily for mood stabilization --Continue trazodone 100 mg nightly as needed for sleep --Patient is not an imminent risk to self or others.  Patient does not meet criteria for inpatient psychiatric hospitalization. --Psychiatry is signing off but will be available for any further questions  Arnoldo Lenis, MD 03/20/2021, 2:01 PM  PGY-1, Resident

## 2021-03-20 NOTE — Progress Notes (Addendum)
Subjective: She did not like that the MRI scanner took 45 minutes's of 30 minutes   Antibiotics:  Anti-infectives (From admission, onward)   Start     Dose/Rate Route Frequency Ordered Stop   03/15/21 1115  penicillin G potassium 12 Million Units in dextrose 5 % 500 mL continuous infusion        12 Million Units 41.7 mL/hr over 12 Hours Intravenous Every 12 hours 03/15/21 1015     03/14/21 1600  penicillin G potassium 4 Million Units in dextrose 5 % 250 mL IVPB  Status:  Discontinued        4 Million Units 250 mL/hr over 60 Minutes Intravenous Every 4 hours 03/14/21 1335 03/14/21 1338   03/14/21 1600  penicillin G potassium 12 Million Units in dextrose 5 % 500 mL continuous infusion  Status:  Discontinued        12 Million Units 41.7 mL/hr over 12 Hours Intravenous Every 12 hours 03/14/21 1340 03/15/21 1015   03/14/21 0800  cefTRIAXone (ROCEPHIN) 2 g in sodium chloride 0.9 % 100 mL IVPB  Status:  Discontinued       "And" Linked Group Details   2 g 200 mL/hr over 30 Minutes Intravenous Every 24 hours 03/13/21 1738 03/14/21 1335   03/13/21 2200  metroNIDAZOLE (FLAGYL) IVPB 500 mg  Status:  Discontinued       "And" Linked Group Details   500 mg 100 mL/hr over 60 Minutes Intravenous Every 8 hours 03/13/21 1738 03/14/21 1335   03/13/21 1130  cefTRIAXone (ROCEPHIN) 2 g in sodium chloride 0.9 % 100 mL IVPB       "And" Linked Group Details   2 g 200 mL/hr over 30 Minutes Intravenous  Once 03/13/21 1119 03/13/21 1617   03/13/21 1130  metroNIDAZOLE (FLAGYL) IVPB 500 mg       "And" Linked Group Details   500 mg 100 mL/hr over 60 Minutes Intravenous  Once 03/13/21 1119 03/13/21 1715      Medications: Scheduled Meds: . divalproex  500 mg Oral BID  . enoxaparin (LOVENOX) injection  40 mg Subcutaneous Q24H  . feeding supplement  237 mL Oral TID BM  . multivitamin with minerals  1 tablet Oral Daily   Continuous Infusions: . penicillin g continuous IV infusion 12 Million Units  (03/20/21 1121)   PRN Meds:.acetaminophen, diclofenac Sodium, traZODone    Objective: Weight change:   Intake/Output Summary (Last 24 hours) at 03/20/2021 1147 Last data filed at 03/19/2021 1940 Gross per 24 hour  Intake --  Output 400 ml  Net -400 ml   Blood pressure 115/74, pulse 91, temperature 98 F (36.7 C), temperature source [place], resp. rate 16, height 5' (1.524 m), weight 72.3 kg, last menstrual period 03/06/2021, SpO2 100 %. Temp:  [97.9 F (36.6 C)-98.8 F (37.1 C)] 98 F (36.7 C) (03/22 0406) Pulse Rate:  [87-98] 91 (03/22 1142) Resp:  [16-20] 16 (03/22 1142) BP: (103-140)/(64-89) 115/74 (03/22 1142) SpO2:  [97 %-100 %] 100 % (03/22 1142)  Physical Exam: Physical Exam Constitutional:      General: She is not in acute distress. HENT:     Head: Atraumatic.  Abdominal:     Palpations: Abdomen is soft.  Musculoskeletal:     Right shoulder: Tenderness present. Decreased range of motion.     Right elbow: Tenderness present.     Right wrist: Tenderness present. Decreased range of motion.       Feet:  Feet:  Right foot:     Skin integrity: Skin integrity normal.     Left foot:     Skin integrity: Skin integrity normal.  Skin:    General: Skin is warm.  Neurological:     General: No focal deficit present.     Mental Status: She is alert.  Psychiatric:        Attention and Perception: Attention normal.        Mood and Affect: Affect is blunt.        Speech: Speech normal.        Behavior: Behavior is cooperative.        Cognition and Memory: Cognition normal.      CBC:    BMET Recent Labs    03/19/21 0250 03/20/21 0037  NA 134* 135  K 3.9 4.6  CL 99 101  CO2 26 27  GLUCOSE 99 91  BUN 5* 5*  CREATININE 0.54 0.57  CALCIUM 7.9* 8.3*     Liver Panel  Recent Labs    03/20/21 0037  PROT 7.0  ALBUMIN 2.1*  AST 32  ALT 30  ALKPHOS 118  BILITOT 0.5       Sedimentation Rate Recent Labs    03/20/21 0037  ESRSEDRATE 110*    C-Reactive Protein Recent Labs    03/20/21 0037  CRP 9.2*    Micro Results: Recent Results (from the past 720 hour(s))  Culture, sputum-assessment     Status: None   Collection Time: 03/13/21  4:37 AM   Specimen: Expectorated Sputum  Result Value Ref Range Status   Specimen Description EXPECTORATED SPUTUM  Final   Special Requests NONE  Final   Sputum evaluation   Final    THIS SPECIMEN IS ACCEPTABLE FOR SPUTUM CULTURE Performed at [facility], [address], [place], [place] [address]    Report Status 03/14/2021 FINAL  Final  Culture, Respiratory w Gram Stain     Status: None   Collection Time: 03/13/21  4:37 AM  Result Value Ref Range Status   Specimen Description EXPECTORATED SPUTUM  Final   Special Requests NONE Reflexed from [id number]  Final   Gram Stain   Final    RARE WBC PRESENT,BOTH PMN AND MONONUCLEAR RARE GRAM POSITIVE COCCI IN PAIRS RARE GRAM NEGATIVE RODS RARE GRAM POSITIVE RODS    Culture   Final    FEW GROUP A STREP (S.PYOGENES) ISOLATED Beta hemolytic streptococci are predictably susceptible to penicillin and other beta lactams. Susceptibility testing not routinely performed. Performed at [facility], [address] [place], [place] [address]    Report Status 03/16/2021 FINAL  Final  Gastrointestinal Panel by PCR , Stool     Status: None   Collection Time: 03/13/21  1:06 PM   Specimen: Stool  Result Value Ref Range Status   Campylobacter species NOT DETECTED NOT DETECTED Final   Plesimonas shigelloides NOT DETECTED NOT DETECTED Final   Salmonella species NOT DETECTED NOT DETECTED Final   Yersinia enterocolitica NOT DETECTED NOT DETECTED Final   Vibrio species NOT DETECTED NOT DETECTED Final   Vibrio cholerae NOT DETECTED NOT DETECTED Final   Enteroaggregative E coli (EAEC) NOT DETECTED NOT DETECTED Final   Enteropathogenic E coli (EPEC) NOT DETECTED NOT DETECTED Final   Enterotoxigenic E coli (ETEC) NOT DETECTED NOT DETECTED Final    Shiga like toxin producing E coli (STEC) NOT DETECTED NOT DETECTED Final   Shigella/Enteroinvasive E coli (EIEC) NOT DETECTED NOT DETECTED Final  Cryptosporidium NOT DETECTED NOT DETECTED Final   Cyclospora cayetanensis NOT DETECTED NOT DETECTED Final   Entamoeba histolytica NOT DETECTED NOT DETECTED Final   Giardia lamblia NOT DETECTED NOT DETECTED Final   Adenovirus F40/41 NOT DETECTED NOT DETECTED Final   Astrovirus NOT DETECTED NOT DETECTED Final   Norovirus GI/GII NOT DETECTED NOT DETECTED Final   Rotavirus A NOT DETECTED NOT DETECTED Final   Sapovirus (I, II, IV, and V) NOT DETECTED NOT DETECTED Final    Comment: Performed at [facility], [address] [place], [place] [address]  C Difficile Quick Screen w PCR reflex     Status: None   Collection Time: 03/13/21  1:06 PM   Specimen: STOOL  Result Value Ref Range Status   C Diff antigen NEGATIVE NEGATIVE Final   C Diff toxin NEGATIVE NEGATIVE Final   C Diff interpretation No C. difficile detected.  Final    Comment: Performed at [facility], [address] [place], [place] [address]  Culture, blood (routine x 2)     Status: Abnormal   Collection Time: 03/13/21  2:00 PM   Specimen: BLOOD  Result Value Ref Range Status   Specimen Description BLOOD SITE NOT SPECIFIED  Final   Special Requests   Final    BOTTLES DRAWN AEROBIC AND ANAEROBIC Blood Culture results may not be optimal due to an inadequate volume of blood received in culture bottles   Culture  Setup Time   Final    GRAM POSITIVE COCCI IN BOTH AEROBIC AND ANAEROBIC BOTTLES CRITICAL VALUE NOTED.  VALUE IS CONSISTENT WITH PREVIOUSLY REPORTED AND CALLED VALUE.    Culture (A)  Final    GROUP A STREP (S.PYOGENES) ISOLATED SUSCEPTIBILITIES PERFORMED ON PREVIOUS CULTURE WITHIN THE LAST 5 DAYS. Performed at [facility], [address], [place], [place] [address]    Report Status 03/16/2021 FINAL  Final  Culture, blood (routine x 2)     Status:  Abnormal   Collection Time: 03/13/21  2:36 PM   Specimen: BLOOD  Result Value Ref Range Status   Specimen Description BLOOD SITE NOT SPECIFIED  Final   Special Requests   Final    BOTTLES DRAWN AEROBIC AND ANAEROBIC Blood Culture results may not be optimal due to an inadequate volume of blood received in culture bottles   Culture  Setup Time   Final    GRAM POSITIVE COCCI IN CHAINS IN BOTH AEROBIC AND ANAEROBIC BOTTLES CRITICAL RESULT CALLED TO, READ BACK BY AND VERIFIED WITH: Marissa Nestle [id number] FCP    Culture (A)  Final    GROUP A STREP (S.PYOGENES) ISOLATED HEALTH DEPARTMENT NOTIFIED Performed at [facility], [address] [place], [place] [address]    Report Status 03/16/2021 FINAL  Final   Organism ID, Bacteria GROUP A STREP (S.PYOGENES) ISOLATED  Final      Susceptibility   Group a strep (s.pyogenes) isolated - MIC*    PENICILLIN <=0.06 SENSITIVE Sensitive     CEFTRIAXONE <=0.12 SENSITIVE Sensitive     ERYTHROMYCIN <=0.12 SENSITIVE Sensitive     LEVOFLOXACIN <=0.25 SENSITIVE Sensitive     VANCOMYCIN <=0.12 SENSITIVE Sensitive     * GROUP A STREP (S.PYOGENES) ISOLATED  Blood Culture ID Panel (Reflexed)     Status: Abnormal   Collection Time: 03/13/21  2:36 PM  Result Value Ref Range Status   Enterococcus faecalis NOT DETECTED NOT DETECTED Final   Enterococcus Faecium NOT DETECTED NOT DETECTED Final  Listeria monocytogenes NOT DETECTED NOT DETECTED Final   Staphylococcus species NOT DETECTED NOT DETECTED Final   Staphylococcus aureus (BCID) NOT DETECTED NOT DETECTED Final   Staphylococcus epidermidis NOT DETECTED NOT DETECTED Final   Staphylococcus lugdunensis NOT DETECTED NOT DETECTED Final   Streptococcus species DETECTED (A) NOT DETECTED Final    Comment: CRITICAL RESULT CALLED TO, READ BACK BY AND VERIFIED WITH: PHARMD THOMAS J [id number] FCP    Streptococcus agalactiae NOT DETECTED NOT DETECTED Final   Streptococcus pneumoniae NOT DETECTED NOT  DETECTED Final   Streptococcus pyogenes DETECTED (A) NOT DETECTED Final    Comment: CRITICAL RESULT CALLED TO, READ BACK BY AND VERIFIED WITH: PHARMD THOMAS J [id number] FCP    A.calcoaceticus-baumannii NOT DETECTED NOT DETECTED Final   Bacteroides fragilis NOT DETECTED NOT DETECTED Final   Enterobacterales NOT DETECTED NOT DETECTED Final   Enterobacter cloacae complex NOT DETECTED NOT DETECTED Final   Escherichia coli NOT DETECTED NOT DETECTED Final   Klebsiella aerogenes NOT DETECTED NOT DETECTED Final   Klebsiella oxytoca NOT DETECTED NOT DETECTED Final   Klebsiella pneumoniae NOT DETECTED NOT DETECTED Final   Proteus species NOT DETECTED NOT DETECTED Final   Salmonella species NOT DETECTED NOT DETECTED Final   Serratia marcescens NOT DETECTED NOT DETECTED Final   Haemophilus influenzae NOT DETECTED NOT DETECTED Final   Neisseria meningitidis NOT DETECTED NOT DETECTED Final   Pseudomonas aeruginosa NOT DETECTED NOT DETECTED Final   Stenotrophomonas maltophilia NOT DETECTED NOT DETECTED Final   Candida albicans NOT DETECTED NOT DETECTED Final   Candida auris NOT DETECTED NOT DETECTED Final   Candida glabrata NOT DETECTED NOT DETECTED Final   Candida krusei NOT DETECTED NOT DETECTED Final   Candida parapsilosis NOT DETECTED NOT DETECTED Final   Candida tropicalis NOT DETECTED NOT DETECTED Final   Cryptococcus neoformans/gattii NOT DETECTED NOT DETECTED Final    Comment: Performed at [facility], [address], [place], [place] [address]  Resp Panel by RT-PCR (Flu A&B, Covid) Nasopharyngeal Swab     Status: None   Collection Time: 03/13/21  2:40 PM   Specimen: Nasopharyngeal Swab; Nasopharyngeal(NP) swabs in vial transport medium  Result Value Ref Range Status   SARS Coronavirus 2 by RT PCR NEGATIVE NEGATIVE Final    Comment: (NOTE) SARS-CoV-2 target nucleic acids are NOT DETECTED.  The SARS-CoV-2 RNA is generally detectable in upper respiratory specimens during the  acute phase of infection. The lowest concentration of SARS-CoV-2 viral copies this assay can detect is 138 copies/mL. A negative result does not preclude SARS-Cov-2 infection and should not be used as the sole basis for treatment or other patient management decisions. A negative result may occur with  improper specimen collection/handling, submission of specimen other than nasopharyngeal swab, presence of viral mutation(s) within the areas targeted by this assay, and inadequate number of viral copies(<138 copies/mL). A negative result must be combined with clinical observations, patient history, and epidemiological information. The expected result is Negative.  Fact Sheet for Patients:  BloggerCourse.com  Fact Sheet for Healthcare Providers:  SeriousBroker.it  This test is no t yet approved or cleared by the [place] FDA and  has been authorized for detection and/or diagnosis of SARS-CoV-2 by FDA under an Emergency Use Authorization (EUA). This EUA will remain  in effect (meaning this test can be used) for the duration of the COVID-19 declaration under Section 564(b)(1) of the Act, 21 U.S.C.section 360bbb-3(b)(1), unless the authorization is terminated  or revoked sooner.  Influenza A by PCR NEGATIVE NEGATIVE Final   Influenza B by PCR NEGATIVE NEGATIVE Final    Comment: (NOTE) The Xpert Xpress SARS-CoV-2/FLU/RSV plus assay is intended as an aid in the diagnosis of influenza from Nasopharyngeal swab specimens and should not be used as a sole basis for treatment. Nasal washings and aspirates are unacceptable for Xpert Xpress SARS-CoV-2/FLU/RSV testing.  Fact Sheet for Patients: BloggerCourse.com  Fact Sheet for Healthcare Providers: SeriousBroker.it  This test is not yet approved or cleared by the [place] FDA and has been authorized for detection and/or  diagnosis of SARS-CoV-2 by FDA under an Emergency Use Authorization (EUA). This EUA will remain in effect (meaning this test can be used) for the duration of the COVID-19 declaration under Section 564(b)(1) of the Act, 21 U.S.C. section 360bbb-3(b)(1), unless the authorization is terminated or revoked.  Performed at [facility], [address], [place], [place] [address]   Culture, blood (routine x 2)     Status: None (Preliminary result)   Collection Time: 03/17/21 12:10 PM   Specimen: BLOOD  Result Value Ref Range Status   Specimen Description BLOOD RIGHT ANTECUBITAL  Final   Special Requests   Final    BOTTLES DRAWN AEROBIC AND ANAEROBIC Blood Culture adequate volume   Culture   Final    NO GROWTH 3 DAYS Performed at [facility], [address] [place], [place] [address]    Report Status PENDING  Incomplete  Culture, blood (routine x 2)     Status: None (Preliminary result)   Collection Time: 03/17/21 12:24 PM   Specimen: BLOOD  Result Value Ref Range Status   Specimen Description BLOOD RIGHT ANTECUBITAL  Final   Special Requests   Final    BOTTLES DRAWN AEROBIC ONLY Blood Culture adequate volume   Culture   Final    NO GROWTH 3 DAYS Performed at [facility], [address], [place], [place] [address]    Report Status PENDING  Incomplete    Studies/Results: MR Shoulder Right W Wo Contrast  Result Date: 03/20/2021 CLINICAL DATA:  Group a Streptococcus bacteremia. Shoulder pain. Evaluate for possible septic arthritis. EXAM: MRI OF THE RIGHT SHOULDER WITHOUT AND WITH CONTRAST TECHNIQUE: Multiplanar, multisequence MR imaging of the right shoulder was performed before and after the administration of intravenous contrast. CONTRAST:  24mL GADAVIST GADOBUTROL 1 MMOL/ML IV SOLN COMPARISON:  None. FINDINGS: Rotator cuff: Minimal rotator cuff tendinopathy/tendinosis. Mild bursal surface fraying and fibrillation. No rotator cuff tear. Muscles: Mild edema like signal  abnormality mainly involving the infraspinatus muscle but also to a lesser extent the supraspinatus muscle. The other shoulder musculature appears normal. Findings could be due to a nontraumatic neurogenic cause such as [place] Turner syndrome. No muscle mass or abnormal muscle enhancement. No findings for pyomyositis. Biceps long head:  Intact Acromioclavicular Joint:  Normal.  No findings for septic arthritis. Glenohumeral Joint: Normal articular cartilage. No joint effusion. No findings for septic arthritis. Labrum:  Intact Bones:  No findings suspicious for osteomyelitis. Other: Moderate subacromial/subdeltoid bursitis with diffuse inflammation/enhancement but I do not see any definite rim enhancing fluid collection to suggest septic bursitis. If symptoms persist or worsen bursal aspiration may be indicated. IMPRESSION: 1. Moderate subacromial/subdeltoid bursitis with diffuse inflammation/enhancement but I do not see any definite rim enhancing fluid collection to suggest septic bursitis. 2. Minimal rotator cuff tendinopathy/tendinosis. No rotator cuff tear. 3. No findings for septic arthritis or osteomyelitis involving the [facility] joint or glenohumeral joint.  4. Mild edema like signal abnormality in the infraspinatus muscle but also to a lesser extent the supraspinatus muscle. Findings could be due to a nontraumatic neurogenic cause such as [place] Turner syndrome. 5. Intact long head biceps tendon and glenoid labrum. Electronically Signed   By: Rudie Meyer M.D.   On: 03/20/2021 10:08   ECHOCARDIOGRAM COMPLETE  Result Date: 03/19/2021    ECHOCARDIOGRAM REPORT   Patient Name:   [facility] Date of Exam: 03/19/2021 Medical Rec #:  [id number]       Height:       60.0 in Accession #:    [id number]      Weight:       159.4 lb Date of Birth:  1991/02/11       BSA:          1.695 m Patient Age:    29 years        BP:           115/71 mmHg Patient Gender: F               HR:           89 bpm. Exam Location:   Inpatient Procedure: 2D Echo                            MODIFIED REPORT: This report was modified by Dietrich Pates MD on 03/19/2021 due to Complete.  Indications:     Bacteremia  History:         Patient has no prior history of Echocardiogram examinations.  Sonographer:     Delcie Roch Referring Phys:  [id number] JULIE Jonnie Kind Diagnosing Phys: Dietrich Pates MD IMPRESSIONS  1. No obvious vegetation. Exam is not adequate to r/o however. Would require TEE if clinically indicated.  2. Left ventricular ejection fraction, by estimation, is 65 to 70%. The left ventricle has normal function. The left ventricle has no regional wall motion abnormalities. Left ventricular diastolic parameters were normal.  3. Right ventricular systolic function is normal. The right ventricular size is normal.  4. The mitral valve is normal in structure. Mild mitral valve regurgitation.  5. The aortic valve is normal in structure. Aortic valve regurgitation is not visualized. FINDINGS  Left Ventricle: Left ventricular ejection fraction, by estimation, is 65 to 70%. The left ventricle has normal function. The left ventricle has no regional wall motion abnormalities. The left ventricular internal cavity size was normal in size. There is  no left ventricular hypertrophy. Left ventricular diastolic parameters were normal. Right Ventricle: The right ventricular size is normal. Right vetricular wall thickness was not assessed. Right ventricular systolic function is normal. Left Atrium: Left atrial size was normal in size. Right Atrium: Right atrial size was normal in size. Pericardium: Trivial pericardial effusion is present. Mitral Valve: The mitral valve is normal in structure. Mild mitral valve regurgitation. Tricuspid Valve: The tricuspid valve is normal in structure. Tricuspid valve regurgitation is trivial. Aortic Valve: The aortic valve is normal in structure. Aortic valve regurgitation is not visualized. Pulmonic Valve: The pulmonic valve was  normal in structure. Pulmonic valve regurgitation is trivial. Aorta: The aortic root is normal in size and structure. IAS/Shunts: No atrial level shunt detected by color flow Doppler.  LEFT VENTRICLE PLAX 2D LVIDd:         4.20 cm  Diastology LVIDs:         2.70 cm  LV e' medial:  10.70 cm/s LV PW:         0.70 cm  LV E/e' medial:  7.4 LV IVS:        0.80 cm  LV e' lateral:   10.00 cm/s LVOT diam:     1.80 cm  LV E/e' lateral: 8.0 LV SV:         46 LV SV Index:   27 LVOT Area:     2.54 cm  RIGHT VENTRICLE             IVC RV S prime:     27.30 cm/s  IVC diam: 1.50 cm TAPSE (M-mode): 2.2 cm LEFT ATRIUM             Index       RIGHT ATRIUM          Index LA diam:        2.40 cm 1.42 cm/m  RA Area:     9.38 cm LA Vol (A2C):   32.9 ml 19.41 ml/m RA Volume:   18.10 ml 10.68 ml/m LA Vol (A4C):   29.4 ml 17.35 ml/m LA Biplane Vol: 32.8 ml 19.35 ml/m  AORTIC VALVE LVOT Vmax:   119.00 cm/s LVOT Vmean:  73.100 cm/s LVOT VTI:    0.181 m  AORTA Ao Asc diam: 3.30 cm MITRAL VALVE MV Area (PHT): 4.49 cm    SHUNTS MV Decel Time: 169 msec    Systemic VTI:  0.18 m MV E velocity: 79.60 cm/s  Systemic Diam: 1.80 cm MV A velocity: 64.30 cm/s MV E/A ratio:  1.24 Dietrich Pates MD Electronically signed by Dietrich Pates MD Signature Date/Time: 03/19/2021/12:29:28 PM    Final (Updated)       Assessment/Plan:  INTERVAL HISTORY: She completed her MRI of shoulder   Principal Problem:   Sepsis (HCC) Active Problems:   Bipolar I disorder (HCC)   PTSD (post-traumatic stress disorder)   Attention deficit hyperactivity disorder (ADHD), predominantly hyperactive type    Amy Murray is a 30 y.o. female with PTSD ADHD bipolar disorder who was admitted with nausea vomiting abdominal pain fevers and new pain in her wrist ultimately elbow shoulder and foot found to have group A streptococcal bacteremia.  1.  Group A streptococcus bacteremia:  Concern for septic shoulder.  MRI does not show this but shows some moderate  subacromial and subdeltoid bursitis with diffuse inflammation some rotator cuff tendinopathy and some mild edema in the infraspinatus muscle and supraspinatus muscle.  It is possible she could have myositis that will later declare itself to be pyomyositis.  I do not think this point is worth imaging or other joints because I do not think we will find something at this point that would be intervened upon by a surgeon.  I do think she needs aggressive imaging of her heart valves with a transesophageal echocardiogram.  We discussed IV antibiotics at home but I am not confident she will be a suitable candidate.  One of the first question she asked was whether she could have sex with the PICC line in place  Tinea penicillin for now but will look to give her bioavailable oral antibiotics with tedezolid followed by linezolid  #2 HCV positive, check RNA and genotype, Concerned she either has used IVD or has had higher risk sex that she endorses. I suppose Tattoo could also be a means she has + test  #3 PrEP: Gust preexposure prophylaxis and she was aware of it if she went on to prep she  would prefer oral therapy as she does not like injections.  We spent greater than 35 minutes with the patient including greater than 50% of time in face to face counsel of the patient guarding the problems listed above and in coordination of her care with cardiology, pharmacy and the primary team. .  Ancil LinseyKhadijah Custodio has an appointment on 04/02/2021 at 1030 AM with Dr. Daiva EvesVan Dam  [facility] in the [facility] at  [address] in [place].  [address], which is located to the left of the elevators.  Phone: [phone]  Fax: [phone]  [email or web address]  She should arrive 15 minutes prior to her appointment.   LOS: 7 days   Acey LavCornelius Van Dam 03/20/2021, 11:47 AM

## 2021-03-20 NOTE — Progress Notes (Addendum)
Subjective: This is hospital day #7 forKhadijah Murray, nodal 30 year old woman admitted for S. pyogenes bacteremia of unknown source.   Yesterday afternoon, she was evaluated by I/D and scheduled to receive MRI of right shoulder for possible septic joint.   Overnight, patient declined Depakote and Lovenox.    This morning, patient notes feeling frustrated by the care she has received, stating that this is the worst hospital experience she has ever had. She was temporarily scared while receiving MRI yesterday but this quickly subsided. She has been in contact with her family, including her mother and daughter, and patient's mother plans to see her in the hospital. She notes continued pain of the right shoulder, wrist, and ankle. Reports limited ROM of the right shoulder and attributes this to feeling "tired" as she has not eaten well over the past day.    Objective: Vital signs in last 24 hours: Vitals:   03/19/21 1650 03/19/21 2152 03/20/21 0019 03/20/21 0406  BP: 118/76 125/89 135/71 140/70  Pulse: 97 98 95 97  Resp: _0 Temp: 98.4 F (36.9 C) 98 F (36.7 C) 97.9 F (36.6 C) 98 F (36.7 C)  TempSrc:  Oral Oral Oral  SpO2: 98% 97% 99% 97% on RA   Physical Exam  General: Sitting on edge of bed, eating breakfast; tearful during encounter; appears upset with current condition Cardiovascular: Regular rate, rhythm; no murmurs, rubs, gallops; 2+ pulses, all extremities  Lungs: Normal pulmonary effort; no respiratory distress  Extremities: No cyanosis, erythema; trace edema of right wrist compared to left wrist; limited active ROM of right shoulder due to fatigue; normal passive ROM of RUE; normal active and passive ROM or LUE; point tenderness, anterior right shoulder; point tenderness, right dorsal mid-foot; moderate warmth of right shoulder, wrist, and ankle compared to left shoulder, wrist, and ankle     Filed Weights   03/15/21 0349 03/19/21 0500  Weight: 67.9 kg 72.3 kg     Intake/Output Summary (Last 24 hours) at 03/20/2021 0649 Last data filed at 03/19/2021 1940 Gross per 24 hour  Intake --  Output 1200 ml  Net -1200 ml   CMP Latest Ref Rng & Units 03/20/2021 03/19/2021 03/18/2021  Glucose 70 - 99 mg/dL 91 99 93  BUN 6 - 20 mg/dL 5(L) 5(L) 7  Creatinine 0.44 - 1.00 mg/dL 0.57 0.54 0.55  Sodium 135 - 145 mmol/L 135 134(L) 135  Potassium 3.5 - 5.1 mmol/L 4.6 3.9 3.5  Chloride 98 - 111 mmol/L 101 99 98  CO2 22 - 32 mmol/L _1 Calcium 8.9 - 10.3 mg/dL 8.3(L) 7.9(L) 7.9(L)  Total Protein 6.5 - 8.1 g/dL 7.0 - -  Total Bilirubin 0.3 - 1.2 mg/dL 0.5 - -  Alkaline Phos 38 - 126 U/L 118 - -  AST 15 - 41 U/L 32 - -  ALT 0 - 44 U/L 30 - -   CBC Latest Ref Rng & Units 03/20/2021 03/19/2021 03/18/2021  WBC 4.0 - 10.5 K/uL 16.4(H) 19.4(H) 23.8(H)  Hemoglobin 12.0 - 15.0 g/dL 8.7(L) 8.4(L) 8.4(L)  Hematocrit 36.0 - 46.0 % 26.4(L) 24.8(L) 23.8(L)  Platelets 150 - 400 K/uL 369 383 237  MCV: 76.1 (from 74.5) RDW: 17.1 (from 16.1) Absolute Neutrophils: 9.4 (elevated)  Absolute Immature Granulocytes: 1.55 Absolute Monocytes: 1.5 WBC Morphology: Mild left shift   Sed Rate: 110 (ref: 0-22) CRP: 9.2 Hepatitis C Antibody: reactive   Blood Culture (03/19) - No growth in 3 days   Imaging in Last  24 Hours:  MR Shoulder Right W Wo Contrast:  1. Moderate subacromial/subdeltoid bursitis with diffuse inflammation/enhancement but I do not see any definite rim enhancing fluid collection to suggest septic bursitis.  2. Minimal rotator cuff tendinopathy/tendinosis. No rotator cuff tear.  3. No findings for septic arthritis or osteomyelitis involving the [facility] joint or glenohumeral joint.  4. Mild edema like signal abnormality in the infraspinatus muscle but also to a lesser extent the supraspinatus muscle. Findings could be due to a nontraumatic neurogenic cause such as [place] Turner syndrome.  5. Intact long head biceps tendon and glenoid  labrum.  Assessment/Plan:  Principal Problem:   Sepsis ([place]) Active Problems:   Bipolar I disorder (HCC)   PTSD (post-traumatic stress disorder)   Attention deficit hyperactivity disorder (ADHD), predominantly hyperactive type  In summary,this is hospital day #7 forKhadijah Murray,a 30 year old womanwith a past medical history of bipolar disorder,PTSD, andADHDwho presented for evaluation of abdominal pain, nausea and vomiting. Found to have sepsis secondary to streptococcus pyogenesbacteremia of unknown source and reactive HCV antibody test.  #Streptococcus pyogenes bacteremia, active Patient continues to improve. On day 8 of IV antibiotics (one day of metronidazole and ceftriaxone,7days of penicillin G).She remains hemodynamically stable and afebrile.This morning, WBC count is down-trending. In the setting of negative blood cultures after three days and improved clinical picture, patient's infection may be resolving. However, her lab findings of elevated WBC count with mild left shift and increased neutrophils are still concerning for poor source control in setting of unknown etiology of bacteremia. Septic arthritis considered given patient's presentation of polyarticular joint pain with limited range of motion, most pronounced at right shoulder. MRI of right shoulder did not demonstrate signs of septic arthritis or osteomyelitis. Moderate subacromial/subdetltoid bursitis with diffuse inflammation was noted. TTE was unremarkable without signs of vegetation. Appreciate I/D recommendations and they suggest a TEE for patient to better assess for endocarditis as valvular vegetations with septic emboli could explain patients polyarticular arthritis picture. However, this is unlikely in setting of no murmur and afebrile patient. Post-streptococcal rheumatic fever or reactive arthritis are also possible given presentation of polyarthritis in setting of confirmed S. Pyogenes bacteremia. Patient  also has a elevated ESR, CRP, and presented with a fever on admission, which meet minor criteria for diagnosis of acute rheumatic fever. However, she does not meet additional major criteria for rheumatic fever, including subcutaneous nodules, erythema marginatum, sydenham chorea, carditis (no murmurs, normal TTE, no chest pain, normal ECG). Furthermore, patient's polyarthritis appears non-migratory, which is more consistent with reactive arthritis in absence of meeting major criteria for rheumatic fever. Naproxen can be given to help differentiate rheumatic fever and reactive arthritis. Significant improvement would be anticipated if rheumatic fever is etiology of her arthritis, while such a response would not be expected with post-streptococcal reactive arthritis.  Plan: - f/u blood cultures x2 -f/uTEE   - Continue IV Penicillin 12 million units Q12H (day7) - Start Naproxen 500 mg BID  - Continue daily CBC for monitoring leukocytosis - Monitor fever curve  #HCV Patient's HCV antibody test was reactive. Patient notes that she does not use IV drugs. She is sexually active with men and women, using contraception inconsistently. 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 Arthritis is noted in 2 to 20% of patients with HCV. Notably, during active HCV, forearm and leg pain are common, which is consistent with our patient's presentation. At this time, patient does not demonstrate clinical signs of active HCV (normal AST/ALT, no RUQ pain, no jaundice).  HCV RNA is pending at this time. Mixed cryoglobunemia is also associated with HCV infections -- usually chronic -- and can present with arthralgias.  Plan:  - Order HCV RNA w/ Reflex  - Order test for Cryoglobulins - Monitor liver function with daily CMP  - Appreciate I/D recommendations   #Bipolar disorder, chronic #PTSD, chronic #ADHD, chronic Patient's mood and behavior continue to be at baseline -- no confusion noted at during  today's encounter. Speech is still somewhat pressured, rapid. She declined her home Depakote last night -- frustration with her care may have played a role. However, she remains motivated to leave the hospital. Will continue to discuss adherence to home medications during future encounters when patient is ready to have this conversation. Psychiatry will no longer be following patient, noting that she does not meet criteria for inpatient management. Outpatient [facility]ry will be [facility] at [facility] as this clinic accepts walk-in appointments and patient is possibly uninsured. Per social work, patient has an appointment to see outpatient PCP on 04/07 pending discharge.  Plan: - Continuehome Depakote ER 500mg twice daily - Continuehome Trazodone 100mg at bedtime PRN  - Monitor liver function with daily CMP - Daily CBC to monitor for myelosuppression   #Microcytic anemia, stable Patient's hemoglobin is stable at 8.7. Microcytic anemia is likely multifactorial secondary to acute inflammatory response in setting of infection and  with a component of and possible beta-thalassemia. Plan: - f/u Hgb electrophoresis to assess for thalassemia  - Hold off on iron administration given active infection  - Daily CBC to monitor hemoglobin  #Hypokalemia,resolved  #Hypovolemic Hyponatremia, resolved  #AKI, resolved     LOS: 7 days   Thakkar, Nandan, Medical Student 03/20/2021, 6:49 AM 

## 2021-03-20 NOTE — Progress Notes (Signed)
    [facility] has been requested to perform a transesophageal echocardiogram on [facility] for bacteremia.  After careful review of history and examination, the risks and benefits of transesophageal echocardiogram have been explained including risks of esophageal damage, perforation (1:10,000 risk), bleeding, pharyngeal hematoma as well as other potential complications associated with conscious sedation including aspiration, arrhythmia, respiratory failure and death. Alternatives to treatment were discussed, questions were answered. Patient is willing to proceed.    Georgie Chard, NP  03/20/2021 2:29 PM

## 2021-03-20 NOTE — Progress Notes (Signed)
Occupational Therapy Treatment Patient Details Name: Amy Murray MRN: [id number] DOB: 11/12/1991 Today's Date: 03/20/2021    History of present illness Amy Murray is a 30 y.o. woman with a past medical history of bipolar disorder, PTSD, ADHD who presents with abdominal pain. Patient states that she was very cold Friday night after getting her hair done, smoking marijuana with her friend, and eating [employer]. On Saturday morning, she woke up feeling weak and had diffuse abdominal pain that was worse on her right-side and flank. She also began to experience several episodes of "explosive", non-bloody diarrhea, which has been intermittently brown and yellow in color. Notes associated nausea and bilious vomiting, which has been worse when trying to eat or drink. Patient also reports pain with urination and darkened urine. Abdominal pain worsened Monday and patient was evaluated at urgent care. Given Zofran and IVF for nausea at that time without relief. Presented to ED today after developing a fever and productive cough in setting of worsening abdominal pain, nausea, vomiting, and weakness. Cough is now productive of green sputum with streaks of blood.   OT comments  Pt not progressing to OOB activity, but moving all limbs in bed "dancing" to music. Pt following all commands, forgets which therapist came in to help her, but fairly good carry over skills. Pt refusing to get OOB no matter how calmly I asked. Pt's MD discussion that pt's deficits stem from behavioral issues. Instructed to elevate R UE/R LE (and ice) and use squeeze ball. PT able to feed self with breaks using RUE and built up grip for utensil. Pt education to continue to use [facility] for toileting instead of purwick with assist from staff. Pt continues to benefit from OT . OT following.     Follow Up Recommendations  Home health OT    Equipment Recommendations  3 in 1 bedside commode    Recommendations for Other Services       Precautions / Restrictions Precautions Precautions: Fall;Other (comment) Precaution Comments: behavioral issues- self limiting Restrictions Weight Bearing Restrictions: No       Mobility Bed Mobility Overal bed mobility: Modified Independent             General bed mobility comments: rolling side to side, dancing in bed, but refused to get OOB. Pt kicking and punching the air, BUEs/BLEs have movement.    Transfers                 General transfer comment: deferred as pt declined    Balance               Standing balance comment: declining EOB                           ADL either performed or assessed with clinical judgement   ADL Overall ADL's : Needs assistance/impaired Eating/Feeding: Set up;Sitting Eating/Feeding Details (indicate cue type and reason): Using RUE for self feeding and use of built up utensil and using 1/2 squeeze ball                                   General ADL Comments: Instructed to elevate R UE/R LE (and ice) and use squeeze ball. Pt education to continue to use [facility] for toileting instead of purwick with assist from staff     Vision   Vision Assessment?: No apparent visual deficits   Perception  Praxis      Cognition Arousal/Alertness: Awake/alert Behavior During Therapy: WFL for tasks assessed/performed Overall Cognitive Status: Difficult to assess                                 General Comments: Pt following all commands, forgets which therapist came in to help her, but fairly good carry over skills. Pt refusing to get OOB no matter how calmly I asked. Pt's MD discussion that pt's deficits stem from behavioral issues.        Exercises     Shoulder Instructions       General Comments VSS    Pertinent Vitals/ Pain       Pain Assessment: 0-10 Pain Score: 7  Pain Location: R foot, R hand Pain Descriptors / Indicators: Discomfort;Sore;Grimacing;Guarding Pain  Intervention(s): Monitored during session;Premedicated before session;Repositioned;Ice applied  Home Living                                          Prior Functioning/Environment              Frequency  Min 2X/week        Progress Toward Goals  OT Goals(current goals can now be found in the care plan section)  Progress towards OT goals: Progressing toward goals  Acute Rehab OT Goals Patient Stated Goal: to go home OT Goal Formulation: With patient Time For Goal Achievement: 03/31/21 Potential to Achieve Goals: Good ADL Goals Pt Will Perform Eating: with modified independence;sitting Pt Will Perform Grooming: with modified independence;standing Pt Will Perform Upper Body Dressing: with modified independence;sitting Pt Will Perform Lower Body Dressing: with modified independence;sit to/from stand Pt Will Transfer to Toilet: with modified independence;ambulating Pt Will Perform Toileting - Clothing Manipulation and hygiene: with modified independence;sit to/from stand Pt/caregiver will Perform Home Exercise Program: Increased ROM;Increased strength;Right Upper extremity;With Supervision  Plan Discharge plan remains appropriate    Co-evaluation                 AM-PAC OT "6 Clicks" Daily Activity     Outcome Measure   Help from another person eating meals?: A Little Help from another person taking care of personal grooming?: A Little Help from another person toileting, which includes using toliet, bedpan, or urinal?: A Lot Help from another person bathing (including washing, rinsing, drying)?: A Lot Help from another person to put on and taking off regular upper body clothing?: A Little Help from another person to put on and taking off regular lower body clothing?: A Lot 6 Click Score: 15    End of Session    OT Visit Diagnosis: Unsteadiness on feet (R26.81);Pain;Other symptoms and signs involving cognitive function   Activity Tolerance  Patient limited by pain   Patient Left in bed;with call bell/phone within reach   Nurse Communication Mobility status        Time: 1310-1326 OT Time Calculation (min): 16 min  Charges: OT General Charges $OT Visit: 1 Visit OT Treatments $Self Care/Home Management : 8-22 mins  Amy Murray, OTR/L Acute Rehabilitation Services Pager: [phone] Office: [phone]    Amy Murray 03/20/2021, 5:35 PM

## 2021-03-21 ENCOUNTER — Inpatient Hospital Stay (HOSPITAL_COMMUNITY): Payer: Self-pay | Admitting: Certified Registered"

## 2021-03-21 ENCOUNTER — Encounter (HOSPITAL_COMMUNITY): Payer: Self-pay | Admitting: Internal Medicine

## 2021-03-21 ENCOUNTER — Inpatient Hospital Stay (HOSPITAL_COMMUNITY): Payer: Self-pay

## 2021-03-21 ENCOUNTER — Encounter (HOSPITAL_COMMUNITY)
Admission: EM | Disposition: A | Discharge status: Home / Self Care | Payer: Self-pay | Source: Home / Self Care | Attending: Internal Medicine

## 2021-03-21 ENCOUNTER — Other Ambulatory Visit: Payer: Self-pay | Admitting: Internal Medicine

## 2021-03-21 DIAGNOSIS — R7881 Bacteremia: Secondary | ICD-10-CM

## 2021-03-21 DIAGNOSIS — M19011 Primary osteoarthritis, right shoulder: Secondary | ICD-10-CM

## 2021-03-21 DIAGNOSIS — M19031 Primary osteoarthritis, right wrist: Secondary | ICD-10-CM

## 2021-03-21 DIAGNOSIS — M19071 Primary osteoarthritis, right ankle and foot: Secondary | ICD-10-CM

## 2021-03-21 DIAGNOSIS — B9682 Vibrio vulnificus as the cause of diseases classified elsewhere: Secondary | ICD-10-CM

## 2021-03-21 HISTORY — PX: TEE WITHOUT CARDIOVERSION: SHX5443

## 2021-03-21 LAB — CBC
HCT: 27.9 % — ABNORMAL LOW (ref 36.0–46.0)
Hemoglobin: 9.2 g/dL — ABNORMAL LOW (ref 12.0–15.0)
MCH: 25.3 pg — ABNORMAL LOW (ref 26.0–34.0)
MCHC: 33 g/dL (ref 30.0–36.0)
MCV: 76.6 fL — ABNORMAL LOW (ref 80.0–100.0)
Platelets: 605 10*3/uL — ABNORMAL HIGH (ref 150–400)
RBC: 3.64 MIL/uL — ABNORMAL LOW (ref 3.87–5.11)
RDW: 17.2 % — ABNORMAL HIGH (ref 11.5–15.5)
WBC: 15.9 10*3/uL — ABNORMAL HIGH (ref 4.0–10.5)
nRBC: 0 % (ref 0.0–0.2)

## 2021-03-21 LAB — COMPREHENSIVE METABOLIC PANEL
ALT: 32 U/L (ref 0–44)
AST: 33 U/L (ref 15–41)
Albumin: 2.2 g/dL — ABNORMAL LOW (ref 3.5–5.0)
Alkaline Phosphatase: 112 U/L (ref 38–126)
Anion gap: 6 (ref 5–15)
BUN: 7 mg/dL (ref 6–20)
CO2: 26 mmol/L (ref 22–32)
Calcium: 8.4 mg/dL — ABNORMAL LOW (ref 8.9–10.3)
Chloride: 100 mmol/L (ref 98–111)
Creatinine, Ser: 0.59 mg/dL (ref 0.44–1.00)
GFR, Estimated: >60 mL/min (ref >60)
Glucose, Bld: 101 mg/dL — ABNORMAL HIGH (ref 70–99)
Potassium: 4.5 mmol/L (ref 3.5–5.1)
Sodium: 132 mmol/L — ABNORMAL LOW (ref 135–145)
Total Bilirubin: 0.6 mg/dL (ref 0.3–1.2)
Total Protein: 7.6 g/dL (ref 6.5–8.1)

## 2021-03-21 LAB — HCV RNA QUANT RFLX ULTRA OR GENOTYP
HCV RNA Qnt(log copy/mL): UNDETERMINED log10 IU/mL
HepC Qn: NOT DETECTED IU/mL

## 2021-03-21 SURGERY — ECHOCARDIOGRAM, TRANSESOPHAGEAL
Anesthesia: Monitor Anesthesia Care

## 2021-03-21 MED ORDER — NAPROXEN 500 MG PO TABS: 500.0000 mg | ORAL_TABLET | Freq: Two times a day (BID) | ORAL | 0 refills | Status: AC

## 2021-03-21 MED ORDER — TEDIZOLID PHOSPHATE 200 MG PO TABS: 200.0000 mg | ORAL_TABLET | Freq: Every day | ORAL | 0 refills | Status: AC

## 2021-03-21 MED ORDER — DIVALPROEX SODIUM ER 500 MG PO TB24: 500.0000 mg | ORAL_TABLET | Freq: Two times a day (BID) | ORAL | 0 refills | Status: AC

## 2021-03-21 MED ORDER — TEDIZOLID PHOSPHATE 200 MG PO TABS: 200.0000 mg | ORAL_TABLET | Freq: Every day | ORAL | 1 refills | Status: AC

## 2021-03-21 MED ORDER — SODIUM CHLORIDE 0.9 % IV SOLN: INTRAVENOUS | Status: DC

## 2021-03-21 MED ORDER — PROPOFOL 500 MG/50ML IV EMUL
INTRAVENOUS | Status: DC | PRN
  Administered 2021-03-21: 200 ug/kg/min via INTRAVENOUS

## 2021-03-21 MED ORDER — LINEZOLID 600 MG PO TABS: 600.0000 mg | ORAL_TABLET | Freq: Two times a day (BID) | ORAL | 0 refills | Status: AC

## 2021-03-21 MED ORDER — SODIUM CHLORIDE 0.9 % IV SOLN
INTRAVENOUS | Status: AC | PRN
  Administered 2021-03-21: 500 mL via INTRAVENOUS

## 2021-03-21 MED ORDER — PROPOFOL 10 MG/ML IV BOLUS
INTRAVENOUS | Status: DC | PRN
  Administered 2021-03-21 (×2): 25 mg via INTRAVENOUS

## 2021-03-21 MED ORDER — TRAZODONE HCL 100 MG PO TABS: 100.0000 mg | ORAL_TABLET | Freq: Every evening | ORAL | 0 refills | Status: AC | PRN

## 2021-03-21 MED ORDER — LIDOCAINE 2% (20 MG/ML) 5 ML SYRINGE
INTRAMUSCULAR | Status: DC | PRN
  Administered 2021-03-21: 80 mg via INTRAVENOUS

## 2021-03-21 MED FILL — SIVEXTRO 200 MG TABS: 200 | 6 days supply | Qty: 6 | Fill #0

## 2021-03-21 MED FILL — traZODone HCL 100 MG TABS: 100 | 30 days supply | Qty: 30 | Fill #0

## 2021-03-21 MED FILL — DIVALPROEX SOD ER 500 MG TA: 500 | 30 days supply | Qty: 60 | Fill #0

## 2021-03-21 MED FILL — NAPROXEN 500 MG TABS: 500 | 30 days supply | Qty: 60 | Fill #0

## 2021-03-21 MED FILL — LINEZOLID 600 MG TABS: 600 | 12 days supply | Qty: 24 | Fill #0

## 2021-03-21 NOTE — Progress Notes (Addendum)
Subjective: No acute overnight events.   This morning, patient notes feeling much better. Has no pain of the shoulders, wrists, and ankles. She has been able to walk to use the restroom. States that her movement is somewhat limited by fatigue. Notes feeling nauseas at the end of our encounter -- states that this occurs occasionally and is caused by feeling too hot.   Objective: Vital signs in last 24 hours: Vitals:   03/20/21 0741 03/20/21 1142 03/20/21 1946 03/20/21 2320  BP: 113/68 115/74 117/64 131/69  Pulse: 87 91 88   Resp: $Remo'16 16 18 18  'mNXAM$ Temp:   99 F (37.2 C) 98.5 F (36.9 C)  TempSrc:   Oral Oral  SpO2: 100% 100% 100% 92%   Physical Exam:  General: Sitting on edge of bed, dancing to music, smiling; appears comfortable Lungs: Normal pulmonary effort; no respiratory distress  Extremities: No cyanosis, erythema, edema; symmetrical warmth at all joints    - Wrists: no edema of right wrist compared to left wrist; no tenderness, bilaterally; normal flexion and extension, bilaterally    - Right Shoulder: limited abduction due to fatigue; normal passive ROM; normal active flexion, extension; normal passive flexion, extension   - Left Shoulder: limited abduction due to fatigue; normal passive ROM; no point tenderness    - Right ankle: no tenderness, right dorsal mid-foot; normal flexion, extension, inversion, eversion Psychiatric: normal attention, speech; elevated mood   No I/O Recorded  Labs in Last 24 Hours:   CBC Latest Ref Rng & Units 03/21/2021 03/20/2021 03/19/2021  WBC 4.0 - 10.5 K/uL 15.9(H) 16.4(H) 19.4(H)  Hemoglobin 12.0 - 15.0 g/dL 9.2(L) 8.7(L) 8.4(L)  Hematocrit 36.0 - 46.0 % 27.9(L) 26.4(L) 24.8(L)  Platelets 150 - 400 K/uL 605(H) 369 383  MCV: 76.6 RDW: 17.2  CMP Latest Ref Rng & Units 03/21/2021 03/20/2021 03/19/2021  Glucose 70 - 99 mg/dL 101(H) 91 99  BUN 6 - 20 mg/dL 7 5(L) 5(L)  Creatinine 0.44 - 1.00 mg/dL 0.59 0.57 0.54  Sodium 135 - 145 mmol/L 132(L)  135 134(L)  Potassium 3.5 - 5.1 mmol/L 4.5 4.6 3.9  Chloride 98 - 111 mmol/L 100 101 99  CO2 22 - 32 mmol/L $RemoveB'26 27 26  'IMSVNHyt$ Calcium 8.9 - 10.3 mg/dL 8.4(L) 8.3(L) 7.9(L)  Total Protein 6.5 - 8.1 g/dL 7.6 7.0 -  Total Bilirubin 0.3 - 1.2 mg/dL 0.6 0.5 -  Alkaline Phos 38 - 126 U/L 112 118 -  AST 15 - 41 U/L 33 32 -  ALT 0 - 44 U/L 32 30 -   Hemoglobin Electrophoresis: Normal hemoglobin present; no thalassemia observed   Blood Culture: no growth after 4 days   HCV Genotype, RNA: pending   Cryoglobulin: pending    Imaging in Last 24 Hours:  ECHO TEE:  1. Left ventricular ejection fraction, by estimation, is 60 to 65%. The left ventricle has normal function.  2. Right ventricular systolic function is normal. The right ventricular size is normal.  3. No left atrial/left atrial appendage thrombus was detected.  4. The mitral valve is normal in structure. Trivial mitral valve regurgitation.  5. The aortic valve is normal in structure. Aortic valve regurgitation is not visualized.   Assessment/Plan:  Principal Problem:   Sepsis ([place]) Active Problems:   Bipolar I disorder (HCC)   PTSD (post-traumatic stress disorder)   Attention deficit hyperactivity disorder (ADHD), predominantly hyperactive type  In summary,this is hospital day #49forKhadijah Murray,a15 year old womanwith a past medical history of bipolar disorder,PTSD, andADHDwho presented  for evaluation of abdominal pain, nausea and vomiting. Found to have sepsis secondary to streptococcus pyogenesbacteremiaof unknown source and a reactive HCV antibody test.  #Streptococcus pyogenes bacteremia, active Patient continues to improve. Notes feeling much better today with reduced pain of the right shoulder, wrist, and ankle. On day9of IV antibiotics (one day of metronidazole and ceftriaxone,8days of penicillin G).She remainshemodynamically stable andafebrile. This morning, her WBC continues to down-trend, suggesting that her  infection may be resolving. TEE was unremarkable without signs of valvular dysfunction -- source of her bacteremia remains unknown. Notably, the etiology of her polyarthritis is also unclear. May be secondary to infectious arthritis that has since improved with antibiotics, which could be consistent with recent unremarkable right shoulder MRI. Aspiration of the shoulder for synovial fluid analysis will not be beneficial at this time as infection may have already cleared. Her arthritis may be secondary to rheumatic fever, which is noted to improve dramatically with naproxen -- she has received two doses of Naproxen 500 mg since yesterday. Unclear if patient meets diagnostic criteria for rheumatic fever, however. She presented with only one major criteria for diagnosis: arthritis. Two minor criteria were also met (fever and elevated inflammatory markers), but they may be related to patient's sepsis secondary to bacteremia. Poststreptococcal reactive arthritis is also possible but this does not respond dramatically to naproxen. Her pain may also be secondary to myositis, but this is unlikely as patient does not have muscular pain. CK on admission was also unremarkable.  Plan: - f/u blood cultures x2 - Continue IV Penicillin 12 million units Q12H (day9) - Appreciate I/D consult; discuss plan for transition to oral antibiotics (consider clindamycin given patient's uninsured status) - Continue Naproxen 500 mg BID  - Continue daily CBC for monitoring leukocytosis - Monitor fever curve  #HCV Patient's HCV antibody test was reactive. Patient notes that she does not use IV drugs. She is sexually active with men and women, using contraception inconsistently. 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 However, she does not demonstrate clinical signs of active HCV (normal AST/ALT, no RUQ pain, no jaundice) at this time. Furthermore, HCV arthritis appears symmetrical which is inconsistent with  patient's distribution. HCV RNA is pending at this time. Mixed cryoglobunemia is also associated with HCV infections -- usually chronic -- and can present with arthralgias.  Plan:  - f/u HCV RNA w/ Reflex  - f/u test for Cryoglobulins - Monitor liver function with daily CMP  - Appreciate I/D recommendations   #Bipolar disorder, chronic #PTSD, chronic #ADHD, chronic Patient's mood and behavior continue to be at baseline -- no confusion noted at during today's encounter. Speech is still somewhat pressured, rapid. She has taken her home medications (depakote, trazodone) since yesterday. Remains motivated to leave the hospital. Outpatient psychiatry will be [facility] at [facility] as patient is uninsured. This clinic accepts walk-in appointments. Will need to discuss plan with patient to determine feasibility. May need to identify a suitable alternative with scheduled appointment to encourage adherence.Per social work, patient also has an appointment to see outpatient PCP on 04/07 pending discharge.  Plan: - Continuehome Depakote ER 500mg  twice daily - Continuehome Trazodone 100mg  at bedtime PRN - Monitor liver function with daily CMP - Daily CBC to monitor for myelosuppression   #Microcytic anemia, stable Patient's hemoglobinis stable at 9.2. This may be close to patient's baseline given her history of regular menstrual cycles and prior pregnancy. Recent acute inflammatory response in setting of infection may also be contributing  to her anemia. No signs of beta-thalassemia on Hgb electrophoresis.  Plan:  -f/uHgb electrophoresis to assess for thalassemia  - Hold off on iron administration given active infection  - Daily CBC to monitor hemoglobin  #Hypokalemia,resolved  #Hypovolemic Hyponatremia, resolved  #AKI, resolved    LOS: 8 days   Gwynneth Albright, Medical Student 03/21/2021, 6:18 AM

## 2021-03-21 NOTE — Interval H&P Note (Signed)
History and Physical Interval Note:  03/21/2021 11:07 AM  Amy Murray  has presented today for surgery, with the diagnosis of bacteremia.  The various methods of treatment have been discussed with the patient and family. After consideration of risks, benefits and other options for treatment, the patient has consented to  Procedure(s): TRANSESOPHAGEAL ECHOCARDIOGRAM (TEE) (N/A) as a surgical intervention.  The patient's history has been reviewed, patient examined, no change in status, stable for surgery.  I have reviewed the patient's chart and labs.  Questions were answered to the patient's satisfaction.     Kristeen Miss

## 2021-03-21 NOTE — Progress Notes (Signed)
  Echocardiogram Echocardiogram Transesophageal has been performed.  Amy Murray 03/21/2021, 1:18 PM

## 2021-03-21 NOTE — Progress Notes (Signed)
Nutrition Follow-up  DOCUMENTATION CODES:   Not applicable  INTERVENTION:   D/c Ensure  Boost Breeze po TID, each supplement provides 250 kcal and 9 grams of protein  Magic cup TID with meals, each supplement provides 290 kcal and 9 grams of protein  Continue MVI with minerals daily  NUTRITION DIAGNOSIS:   Inadequate oral intake related to nausea,vomiting as evidenced by per patient/family report.  ongoing  GOAL:   Patient will meet greater than or equal to 90% of their needs  progressing  MONITOR:   PO intake,Supplement acceptance,Weight trends,Labs,I & O's  REASON FOR ASSESSMENT:   Malnutrition Screening Tool    ASSESSMENT:   Pt admitted with abdominal pain and sepsis 2/2 bacteremia w/ S. Pyogenes. PMH includes bipolar disorder, PTSD, ADHD  Pt unavailable at time of RD visit x2 attempts. Pt having TEE today.   No PO intake documented. Per RN, pt reported feeling much better this morning and has been able to walk to the restroom, though is still experiencing fatigue. Pt noted to still be experiencing intermittent nausea. Pt with orders for Ensure TID but has refused the supplement more than she has accepted it. Will d/c and trial additional supplements.   UOP: x24 hours  Medications reviewed. Labs: Na 132 (L)  Diet Order:   Diet Order            Diet regular Room service appropriate? Yes; Fluid consistency: Thin  Diet effective now           Diet - low sodium heart healthy                 EDUCATION NEEDS:   No education needs have been identified at this time  Skin:  Skin Assessment: Reviewed RN Assessment  Last BM:  3/18  Height:   Ht Readings from Last 1 Encounters:  03/15/21 5' (1.524 m)    Weight:   Wt Readings from Last 1 Encounters:  03/19/21 72.3 kg    BMI:  Body mass index is 31.13 kg/m.  Estimated Nutritional Needs:   Kcal:  1700-1900  Protein:  85-95 grams  Fluid:  >1.7L/d    Eugene Gavia, MS, RD,  LDN RD pager number and weekend/on-call pager number located in Amion.

## 2021-03-21 NOTE — CV Procedure (Signed)
    Transesophageal Echocardiogram Note  Palmer Shorey [id number] February 23, 1991  Procedure: Transesophageal Echocardiogram Indications: bacteremia   Procedure Details Consent: Obtained Time Out: Verified patient identification, verified procedure, site/side was marked, verified correct patient position, special equipment/implants available, Radiology Safety Procedures followed,  medications/allergies/relevent history reviewed, required imaging and test results available.  Performed  Medications:  During this procedure the patient is administered Lidocaine 80 mg IV followed by Propofol drip 200 mg IV by Lafonda Mosses CRNA for sedation.  The patient's heart rate, blood pressure, and oxygen saturation are monitored continuously during the procedure. The period of conscious sedation is  30  minutes, of which I was present face-to-face 100% of this time.  Left Ventrical:  Normal LV function   Mitral Valve:  Normal MV,  No vegetations   Aortic Valve: normal 3 leafltet valve,  No vegetations   Tricuspid Valve: normal ,  Trace TR   Pulmonic Valve: trivial PI   Left Atrium/ Left atrial appendage: no thrombi   Atrial septum: no evidence of ASD or PFO   Aorta: normal   Complications: No apparent complications Patient did tolerate procedure well.   Vesta Mixer, Montez Hageman., MD, [facility] 03/21/2021, 11:51 AM

## 2021-03-21 NOTE — Discharge Instructions (Signed)
Amy Murray,  Thank you for trusting [place] with your care. Your follow up appointments are listed on this after visit summary.   Trey Paula

## 2021-03-21 NOTE — Discharge Summary (Signed)
Name: Amy Murray MRN: [id number] DOB: 1991-05-06 30 y.o. PCP: Patient, No Pcp Per  Date of Admission: 03/13/2021  9:25 AM Date of Discharge: 03/21/2021 Attending Physician: Charissa Bash, MD  Discharge Diagnosis: 1. Principal Problem:   Sepsis (HCC) Active Problems:   Bipolar I disorder (HCC)   PTSD (post-traumatic stress disorder)   Attention deficit hyperactivity disorder (ADHD), predominantly hyperactive type  Discharge Medications: Allergies as of 03/21/2021   No Known Allergies     Medication List    TAKE these medications   divalproex 500 MG 24 hr tablet Commonly known as: DEPAKOTE ER Take 1 tablet (500 mg total) by mouth 2 (two) times daily.   linezolid 600 MG tablet Commonly known as: ZYVOX Take 1 tablet (600 mg total) by mouth 2 (two) times daily for 12 days. Begin taking on 04/02/2021 Start taking on: April 02, 2021   naproxen 500 MG tablet Commonly known as: NAPROSYN Take 1 tablet (500 mg total) by mouth 2 (two) times daily with a meal.   Tedizolid Phosphate 200 MG Tabs Take 200 mg by mouth daily for 5 days.   Tedizolid Phosphate 200 MG Tabs Take 200 mg by mouth daily for 6 days. Start taking on: March 27, 2021   Tedizolid Phosphate 200 MG Tabs Take 200 mg by mouth daily. Start taking on: March 27, 2021   traZODone 100 MG tablet Commonly known as: DESYREL Take 1 tablet (100 mg total) by mouth at bedtime as needed for sleep.      Disposition and follow-up:   Ms.Amy Murray was discharged from [facility] in stable condition.  At the hospital follow up visit please address:  1.  Streptococcus pyogenes bacteremia: Patient presented with abdominal pain, nausea, vomiting, fevers found to have sepsis secondary to strep pyogenes secondary to an unclear source. Patient received 9 days of IV antibiotics (8 days of penicillin) and was discharged with 12 days of tedozilid followed by 12 days of linezolid. Patient scheduled to follow-up  with ID clinic on 03/30/21. Please ensure patient has remained adherent to antibiotics and followed up with infectious disease. Consider obtaining CBC and CMP.  Positive Hepatitis C antibody: Patient tested positive for hepatitis C antibody on 03/20/21. Patient has no prior history of IV drug use, however she has high risk sexual behavior. Patient obtained hepatitis C RNA and genotype, however these results were pending on time of discharge. Please follow-up Hepatitis C RNA and genotype. Patient scheduled to follow-up with ID clinic on 03/30/21.  Bipolar disorder/ADHD/PTSD: Patient has history of untreated bipolar disorder. Psychiatry was consulted with recommendation for outpatient psychiatry follow-up at [facility]. This clinic did not accept appointments, therefore patient will need to present as a walk-in. Please ensure that patient has followed up with this clinic or consider placing referral for alternative psychiatry clinic. Patient discharged with Depakote 500mg  ER twice daily and trazodone 100mg  nightly PRN. Please ensure adherence to this regimen as she was discharged with 30-day supply.  Arthritis/arthalgias/myalgias: Patient initially with severe muscle and joint pain limiting ambulation and activities of daily living. These symptoms resolved on day of discharge and patient able to ambulate without difficulty and engage in ADLs. Etiology unclear, however thought to be secondary to above three diagnoses. Patient prescribed scheduled naproxen 500mg  twice daily and provided 30-day supply.  2.  Labs / imaging needed at time of follow-up: CBC with Diff, CMP  3.  Pending labs/ test needing follow-up:  -HCV RNA quant rflx ultra  or genotype collected 03/20 -Hepatitis C genotype collected 03/23 -Cryoglobulin collected 03/23  Follow-up Appointments:  Follow-up Information    Medicine, [facility]. Go on 04/05/2021.   Specialty: Family Medicine Why:  Please attend your primary care appointmet with Ms. Amy Knife, FNP, on Thursday, 04/05/21, at 9:30am. Contact information: [address] [place] [address] [phone]        [facility] Follow up.   Contact information:  [address],  [place], [place] [address] [phone]       Daiva Eves, Lisette Grinder, MD Follow up on 03/30/2021.   Specialty: Infectious Diseases Contact information: [address] [place] [address] [phone]              Hospital Course by problem list: #Streptococcus pyogenes bacteremia, active Patient presented to [facility] on 03/15 with fever, generalized abdominal pain, nausea, and vomiting. Admitted for medical management of sepsis secondary to S. Pyogenes bacteremia of unknown source. Improved with course of IV antibiotics as patient remained hemodynamically stable and afebrile after second day of admission. WBC count initially trended up before trending down prior to discharge. Etiology of bacteremia is unknown and infectious disease team was consulted. Patient had unremarkable CT Abdomen Pelvis, TTE, TEE, and right shoulder MRI, lowering concern for septic arthritis and endocarditis. Provided oral antibiotics to continue in the outpatient setting. She endorses understanding of how to appropriately take the prescribed antibiotic course.  Plan:  - Start 12-day course of Tedizolid (once daily, beginning on 03/23) followed by 12-day course of Linezolid (BID, beginning on 04/04)  - Outpatient follow-up with I/D on 04.01    #Bipolar disorder, chronic #PTSD, chronic #ADHD, chronic Patient has a past medical history of bipolar disorder, ADHD, and PTSD. Diagnosis was made 3 years ago and patient has not been taking her home medications for 1 year. Presented with elevated mood and episodes of possible psychosis during admission. Psychiatry was consulted and patient was resumed on her home Depakote and Trazodone. Mood and  behavior returned to baseline. Patient understands importance of taking her medications regularly and is motivated to establish care in the outpatient setting. Plan to establish outpatient care at [facility] as patient is uninsured. Center is walk-in only, which she understands. She is making arrangements to go to the clinic next week. Work-note and 30-day course of home medications provided before discharge.  Plan: - Continue home Depakote ER 500mg  twice daily - Continue home Trazodone 100mg  at bedtime PRN   #HCV HCV antibody test was reactive. No IV drug use. Sexually active with men and women, using contraception inconsistently. Unclear if patient has an active infection. She was not symptomatic during admission. HCV RNA and genotype testing pending prior to discharge.  Plan:  - Outpatient follow-up with I/D on 04.01   #Arthralgias and myalgias  Patient presented with asymmetric polyarthritis of the right shoulder, wrist, and ankle with associated warmth, limited ROM, and trace edema. Point tenderness of the shoulder and ankle also noted. MRI negative for septic arthritis. Unclear etiology of joint pains, possibly secondary to rheumatic fever in setting of S. Pyogenes bacteremia. Pain improved over the course of admission with supportive care including naproxen, Voltaren gel, ice, and ACE wraps. PT/OT also consulted, and they suggested home health given patient's limited ability to get out of bed and walk without assistance. Notably, patient was walking without difficulty prior to discharge. - Naproxen 500 mg BID for one week   #Microcytic anemia, stable  Patient's hemoglobin is stable at 9.2 prior to discharge. This may be close to patient's baseline given her history of regular menstrual cycles and prior pregnancy. Recent acute inflammatory response in setting of infection may have also contributed to her anemia. No signs of beta-thalassemia on Hgb electrophoresis.   - Iron supplementation after infection resolves   Pertinent Labs, Studies, and Procedures:  CBC Latest Ref Rng & Units 03/21/2021 03/20/2021 03/19/2021  WBC 4.0 - 10.5 K/uL 15.9(H) 16.4(H) 19.4(H)  Hemoglobin 12.0 - 15.0 g/dL 1.6(X) 0.9(U) 0.4(V)  Hematocrit 36.0 - 46.0 % 27.9(L) 26.4(L) 24.8(L)  Platelets 150 - 400 K/uL 605(H) 369 383   CMP Latest Ref Rng & Units 03/21/2021 03/20/2021 03/19/2021  Glucose 70 - 99 mg/dL 409(W) 91 99  BUN 6 - 20 mg/dL 7 5(L) 5(L)  Creatinine 0.44 - 1.00 mg/dL 1.19 1.47 8.29  Sodium 135 - 145 mmol/L 132(L) 135 134(L)  Potassium 3.5 - 5.1 mmol/L 4.5 4.6 3.9  Chloride 98 - 111 mmol/L 100 101 99  CO2 22 - 32 mmol/L Calcium 8.9 - 10.3 mg/dL 5.6(O) 8.3(L) 7.9(L)  Total Protein 6.5 - 8.1 g/dL 7.6 7.0 -  Total Bilirubin 0.3 - 1.2 mg/dL 0.6 0.5 -  Alkaline Phos 38 - 126 U/L 112 118 -  AST 15 - 41 U/L 33 32 -  ALT 0 - 44 U/L 32 30 -  DG Chest 1 View  Result Date: 03/13/2021 CLINICAL DATA:  Sepsis EXAM: CHEST  1 VIEW COMPARISON:  None. FINDINGS: 1309 hours. Low volume rotated film. Cardiopericardial silhouette is at upper limits of normal for size. There is pulmonary vascular congestion without overt pulmonary edema. No pleural effusion. The visualized bony structures of the thorax show no acute abnormality. Telemetry leads overlie the chest. IMPRESSION: Low volume rotated film with pulmonary vascular congestion. Electronically Signed   By: Kennith Center M.D.   On: 03/13/2021 13:35   CT Abdomen Pelvis W Contrast  Result Date: 03/13/2021 CLINICAL DATA:  Abdominal pain and fever.  Nausea and vomiting. EXAM: CT ABDOMEN AND PELVIS WITH CONTRAST TECHNIQUE: Multidetector CT imaging of the abdomen and pelvis was performed using the standard protocol following bolus administration of intravenous contrast. CONTRAST:  OMNIPAQUE IOHEXOL 300 MG/ML  SOLN COMPARISON:  None. FINDINGS: Lower chest: Small focus of consolidation in the medial RIGHT lung base measures 6 cm  by 2.9 cm (image 9/series 4) mild atelectasis at the LEFT lung base. Hepatobiliary: No focal hepatic lesion. No biliary duct dilatation. Common bile duct is normal. Pancreas: Pancreas is normal. No ductal dilatation. No pancreatic inflammation. Spleen: Normal spleen Adrenals/urinary tract: Adrenal glands normal. Kidneys enhance symmetrically. No hydronephrosis. Ureters appear normal. There are 2 foci of gas within the bladder which collect along the non dependent wall the bladder. There is haziness in the retroperitoneal fat between the bladder and uterus (image 72/3). There is thickening of the bladder wall seen on coronal projection 58/6 measuring 5 mm. Stomach/Bowel: Stomach, small-bowel and cecum normal. Appendix is normal. Ascending, transverse and descending colon normal. Rectosigmoid colon normal. Vascular/Lymphatic: Abdominal aorta is normal caliber. No periportal or retroperitoneal adenopathy. No pelvic adenopathy. Reproductive: Uterus is grossly normal. Again indistinct hazy fat between the uterus and the posterior wall the bladder. Ovaries are grossly normal. Other: Small amount free fluid in the posterior cul-de-sac simple fluid attenuation. No abscess in the abdomen pelvis. Musculoskeletal: No aggressive osseous lesion. IMPRESSION: 1. A small foci of gas within the bladder coupled with bladder wall thickening  and haziness in the fat between the bladder and the uterus. Findings suggest cystitis. Recommend correlation with instrumentation of the bladder. 2. kidneys appear normal out evidence obstruction or infection. 3. Small focus of consolidation at the medial RIGHT lung base suggests early pneumonia. Aspiration pneumonitis could have similar pattern. 4. Appendix is normal. 5. Ovaries appear normal. Uterus grossly normal other than haziness in the fat plane between the anterior wall of the uterus and posterior wall of the bladder. Findings conveyed toLAURA MURPHY on 03/13/2021  at13:18. Electronically  Signed   By: Genevive Bi M.D.   On: 03/13/2021 13:19   MR Shoulder Right W Wo Contrast  Result Date: 03/20/2021 CLINICAL DATA:  Group a Streptococcus bacteremia. Shoulder pain. Evaluate for possible septic arthritis. EXAM: MRI OF THE RIGHT SHOULDER WITHOUT AND WITH CONTRAST TECHNIQUE: Multiplanar, multisequence MR imaging of the right shoulder was performed before and after the administration of intravenous contrast. CONTRAST:  7mL GADAVIST GADOBUTROL 1 MMOL/ML IV SOLN COMPARISON:  None. FINDINGS: Rotator cuff: Minimal rotator cuff tendinopathy/tendinosis. Mild bursal surface fraying and fibrillation. No rotator cuff tear. Muscles: Mild edema like signal abnormality mainly involving the infraspinatus muscle but also to a lesser extent the supraspinatus muscle. The other shoulder musculature appears normal. Findings could be due to a nontraumatic neurogenic cause such as [place] Turner syndrome. No muscle mass or abnormal muscle enhancement. No findings for pyomyositis. Biceps long head:  Intact Acromioclavicular Joint:  Normal.  No findings for septic arthritis. Glenohumeral Joint: Normal articular cartilage. No joint effusion. No findings for septic arthritis. Labrum:  Intact Bones:  No findings suspicious for osteomyelitis. Other: Moderate subacromial/subdeltoid bursitis with diffuse inflammation/enhancement but I do not see any definite rim enhancing fluid collection to suggest septic bursitis. If symptoms persist or worsen bursal aspiration may be indicated. IMPRESSION: 1. Moderate subacromial/subdeltoid bursitis with diffuse inflammation/enhancement but I do not see any definite rim enhancing fluid collection to suggest septic bursitis. 2. Minimal rotator cuff tendinopathy/tendinosis. No rotator cuff tear. 3. No findings for septic arthritis or osteomyelitis involving the [facility] joint or glenohumeral joint. 4. Mild edema like signal abnormality in the infraspinatus muscle but also to a lesser extent the  supraspinatus muscle. Findings could be due to a nontraumatic neurogenic cause such as [place] Turner syndrome. 5. Intact long head biceps tendon and glenoid labrum. Electronically Signed   By: Rudie Meyer M.D.   On: 03/20/2021 10:08   ECHOCARDIOGRAM COMPLETE  Result Date: 03/19/2021    ECHOCARDIOGRAM REPORT   Patient Name:   [facility] Date of Exam: 03/19/2021 Medical Rec #:  [id number]       Height:       60.0 in Accession #:    [id number]      Weight:       159.4 lb Date of Birth:  [birth year]       BSA:          1.695 m Patient Age:    29 years        BP:           115/71 mmHg Patient Gender: F               HR:           89 bpm. Exam Location:  Inpatient Procedure: 2D Echo                            MODIFIED REPORT: This report was modified by Gunnar Fusi  Ross MD on 03/19/2021 due to Complete.  Indications:     Bacteremia  History:         Patient has no prior history of Echocardiogram examinations.  Sonographer:     Delcie Roch Referring Phys:  [id number] JULIE Jonnie Kind Diagnosing Phys: Dietrich Pates MD IMPRESSIONS  1. No obvious vegetation. Exam is not adequate to r/o however. Would require TEE if clinically indicated.  2. Left ventricular ejection fraction, by estimation, is 65 to 70%. The left ventricle has normal function. The left ventricle has no regional wall motion abnormalities. Left ventricular diastolic parameters were normal.  3. Right ventricular systolic function is normal. The right ventricular size is normal.  4. The mitral valve is normal in structure. Mild mitral valve regurgitation.  5. The aortic valve is normal in structure. Aortic valve regurgitation is not visualized. FINDINGS  Left Ventricle: Left ventricular ejection fraction, by estimation, is 65 to 70%. The left ventricle has normal function. The left ventricle has no regional wall motion abnormalities. The left ventricular internal cavity size was normal in size. There is  no left ventricular hypertrophy. Left ventricular  diastolic parameters were normal. Right Ventricle: The right ventricular size is normal. Right vetricular wall thickness was not assessed. Right ventricular systolic function is normal. Left Atrium: Left atrial size was normal in size. Right Atrium: Right atrial size was normal in size. Pericardium: Trivial pericardial effusion is present. Mitral Valve: The mitral valve is normal in structure. Mild mitral valve regurgitation. Tricuspid Valve: The tricuspid valve is normal in structure. Tricuspid valve regurgitation is trivial. Aortic Valve: The aortic valve is normal in structure. Aortic valve regurgitation is not visualized. Pulmonic Valve: The pulmonic valve was normal in structure. Pulmonic valve regurgitation is trivial. Aorta: The aortic root is normal in size and structure. IAS/Shunts: No atrial level shunt detected by color flow Doppler.  LEFT VENTRICLE PLAX 2D LVIDd:         4.20 cm  Diastology LVIDs:         2.70 cm  LV e' medial:    10.70 cm/s LV PW:         0.70 cm  LV E/e' medial:  7.4 LV IVS:        0.80 cm  LV e' lateral:   10.00 cm/s LVOT diam:     1.80 cm  LV E/e' lateral: 8.0 LV SV:         46 LV SV Index:   27 LVOT Area:     2.54 cm  RIGHT VENTRICLE             IVC RV S prime:     27.30 cm/s  IVC diam: 1.50 cm TAPSE (M-mode): 2.2 cm LEFT ATRIUM             Index       RIGHT ATRIUM          Index LA diam:        2.40 cm 1.42 cm/m  RA Area:     9.38 cm LA Vol (A2C):   32.9 ml 19.41 ml/m RA Volume:   18.10 ml 10.68 ml/m LA Vol (A4C):   29.4 ml 17.35 ml/m LA Biplane Vol: 32.8 ml 19.35 ml/m  AORTIC VALVE LVOT Vmax:   119.00 cm/s LVOT Vmean:  73.100 cm/s LVOT VTI:    0.181 m  AORTA Ao Asc diam: 3.30 cm MITRAL VALVE MV Area (PHT): 4.49 cm    SHUNTS MV Decel Time: 169 msec  Systemic VTI:  0.18 m MV E velocity: 79.60 cm/s  Systemic Diam: 1.80 cm MV A velocity: 64.30 cm/s MV E/A ratio:  1.24 Dietrich Pates MD Electronically signed by Dietrich Pates MD Signature Date/Time: 03/19/2021/12:29:28 PM    Final  (Updated)    ECHO TEE  Result Date: 03/21/2021    TRANSESOPHOGEAL ECHO REPORT   Patient Name:   [facility] Date of Exam: 03/21/2021 Medical Rec #:  [id number]       Height:       60.0 in Accession #:    [id number]      Weight:       159.4 lb Date of Birth:  08-16-1991       BSA:          1.695 m Patient Age:    29 years        BP:           93/61 mmHg Patient Gender: F               HR:           93 bpm. Exam Location:  Inpatient Procedure: Transesophageal Echo, Cardiac Doppler and Color Doppler Indications:    Bacteremia  History:        Patient has prior history of Echocardiogram examinations, most                 recent 03/19/2021. Signs/Symptoms:Bacteremia and Fever. Sepsis.                 Hep. C.  Sonographer:    Lavenia Atlas Referring Phys: [phone] JILL D MCDANIEL PROCEDURE: The transesophogeal probe was passed without difficulty through the esophogus of the patient. Sedation performed by performing physician. The patient was monitored while under deep sedation. Anesthestetic sedation was provided intravenously by  Anesthesiology: 180.14mg  of Propofol,  of Lidocaine. The patient developed no complications during the procedure. IMPRESSIONS  1. Left ventricular ejection fraction, by estimation, is 60 to 65%. The left ventricle has normal function.  2. Right ventricular systolic function is normal. The right ventricular size is normal.  3. No left atrial/left atrial appendage thrombus was detected.  4. The mitral valve is normal in structure. Trivial mitral valve regurgitation.  5. The aortic valve is normal in structure. Aortic valve regurgitation is not visualized. FINDINGS  Left Ventricle: Left ventricular ejection fraction, by estimation, is 60 to 65%. The left ventricle has normal function. The left ventricular internal cavity size was normal in size. Right Ventricle: The right ventricular size is normal. No increase in right ventricular wall thickness. Right ventricular systolic function is  normal. Left Atrium: Left atrial size was normal in size. No left atrial/left atrial appendage thrombus was detected. Right Atrium: Right atrial size was normal in size. Pericardium: Trivial pericardial effusion is present. Mitral Valve: The mitral valve is normal in structure. Trivial mitral valve regurgitation. There is no evidence of mitral valve vegetation. Tricuspid Valve: The tricuspid valve is normal in structure. Tricuspid valve regurgitation is trivial. Aortic Valve: The aortic valve is normal in structure. Aortic valve regurgitation is not visualized. There is no evidence of aortic valve vegetation. Pulmonic Valve: The pulmonic valve was normal in structure. Pulmonic valve regurgitation is trivial. There is no evidence of pulmonic valve vegetation. Aorta: The aortic root and ascending aorta are structurally normal, with no evidence of dilitation. IAS/Shunts: No atrial level shunt detected by color flow Doppler. Kristeen Miss MD Electronically signed by Kristeen Miss MD Signature Date/Time: 03/21/2021/3:18:24 PM  Final    US PELVIC COMPLETE W TRANSVAGINAL AND TORSION R/O  Result Date: 03/13/2021 CLINICAL DATA:  Acute pelvic pain. EXAM: TRANSABDOMINAL AND TRANSVAGINAL ULTRASOUND OF PELVIS DOPPLER ULTRASOUND OF OVARIES TECHNIQUE: Both transabdominal and transvaginal ultrasound examinations of the pelvis were performed. Transabdominal technique was performed for global imaging of the pelvis including uterus, ovaries, adnexal regions, and pelvic cul-de-sac. It was necessary to proceed with endovaginal exam following the transabdominal exam to visualize the ovaries. Color and duplex Doppler ultrasound was utilized to evaluate blood flow to the ovaries. COMPARISON:  CT of same day FINDINGS: Uterus Measurements: 10.4 x 4.1 x 4.1 cm = volume: 92 mL. No fibroids or other mass visualized. Endometrium Thickness: 5 mm which is within normal limits. No focal abnormality visualized. Right ovary Measurements: 3.1 x  2.9 x 1.7 cm = volume: 8 mL. Normal appearance/no adnexal mass. Left ovary Measurements: 3.1 x 2.3 x 1.6 cm = volume: 6 mL. Normal appearance/no adnexal mass. Pulsed Doppler evaluation of both ovaries demonstrates normal low-resistance arterial and venous waveforms. Other findings Small collection of fluid is seen in the posterior cul-de-sac. IMPRESSION: No significant abnormality seen in the pelvis. There is no evidence of ovarian torsion. Electronically Signed   By: Lupita RaiderJames  Green Jr M.D.   On: 03/13/2021 16:22   Discharge Instructions: Discharge Instructions    Diet - low sodium heart healthy   Complete by: As directed    Increase activity slowly   Complete by: As directed      Signed: Roylene ReasonJohnson, Arletta Lumadue, MD 03/22/2021, 4:18 PM   Pager: [phone]

## 2021-03-21 NOTE — Anesthesia Procedure Notes (Signed)
Procedure Name: MAC Date/Time: 03/21/2021 11:46 AM Performed by: Imagene Riches, CRNA Pre-anesthesia Checklist: Patient identified, Emergency Drugs available, Suction available, Patient being monitored and Timeout performed Patient Re-evaluated:Patient Re-evaluated prior to induction Oxygen Delivery Method: Nasal cannula

## 2021-03-21 NOTE — Progress Notes (Signed)
Subjective: Amy Murray is feeling much better today   Antibiotics:  Anti-infectives (From admission, onward)   Start     Dose/Rate Route Frequency Ordered Stop   04/02/21 0000  linezolid (ZYVOX) 600 MG tablet        600 mg Oral 2 times daily 03/21/21 0955 04/14/21 2359   03/27/21 0000  Tedizolid Phosphate 200 MG TABS        200 mg Oral Daily 03/21/21 0955 04/02/21 2359   03/21/21 0000  Tedizolid Phosphate 200 MG TABS        200 mg Oral Daily 03/21/21 0955 03/26/21 2359   03/15/21 1115  penicillin G potassium 12 Million Units in dextrose 5 % 500 mL continuous infusion        12 Million Units 41.7 mL/hr over 12 Hours Intravenous Every 12 hours 03/15/21 1015     03/14/21 1600  penicillin G potassium 4 Million Units in dextrose 5 % 250 mL IVPB  Status:  Discontinued        4 Million Units 250 mL/hr over 60 Minutes Intravenous Every 4 hours 03/14/21 1335 03/14/21 1338   03/14/21 1600  penicillin G potassium 12 Million Units in dextrose 5 % 500 mL continuous infusion  Status:  Discontinued        12 Million Units 41.7 mL/hr over 12 Hours Intravenous Every 12 hours 03/14/21 1340 03/15/21 1015   03/14/21 0800  cefTRIAXone (ROCEPHIN) 2 g in sodium chloride 0.9 % 100 mL IVPB  Status:  Discontinued       "And" Linked Group Details   2 g 200 mL/hr over 30 Minutes Intravenous Every 24 hours 03/13/21 1738 03/14/21 1335   03/13/21 2200  metroNIDAZOLE (FLAGYL) IVPB 500 mg  Status:  Discontinued       "And" Linked Group Details   500 mg 100 mL/hr over 60 Minutes Intravenous Every 8 hours 03/13/21 1738 03/14/21 1335   03/13/21 1130  cefTRIAXone (ROCEPHIN) 2 g in sodium chloride 0.9 % 100 mL IVPB       "And" Linked Group Details   2 g 200 mL/hr over 30 Minutes Intravenous  Once 03/13/21 1119 03/13/21 1617   03/13/21 1130  metroNIDAZOLE (FLAGYL) IVPB 500 mg       "And" Linked Group Details   500 mg 100 mL/hr over 60 Minutes Intravenous  Once 03/13/21 1119 03/13/21 1715       Medications: Scheduled Meds:  divalproex  500 mg Oral BID   enoxaparin (LOVENOX) injection  40 mg Subcutaneous Q24H   feeding supplement  237 mL Oral TID BM   multivitamin with minerals  1 tablet Oral Daily   naproxen  500 mg Oral BID WC   Continuous Infusions:  penicillin g continuous IV infusion 12 Million Units (03/20/21 2323)   PRN Meds:.acetaminophen, diclofenac Sodium, traZODone    Objective: Weight change:   Intake/Output Summary (Last 24 hours) at 03/21/2021 1049 Last data filed at 03/21/2021 0654 Gross per 24 hour  Intake --  Output 600 ml  Net -600 ml   Blood pressure 131/69, pulse 88, temperature 98.5 F (36.9 C), temperature source Oral, resp. rate 18, height 5' (1.524 m), weight 72.3 kg, last menstrual period 03/06/2021, SpO2 92 %. Temp:  [98.5 F (36.9 C)-99 F (37.2 C)] 98.5 F (36.9 C) (03/22 2320) Pulse Rate:  [88-91] 88 (03/22 1946) Resp:  [16-18] 18 (03/22 2320) BP: (115-131)/(64-74) 131/69 (03/22 2320) SpO2:  [92 %-100 %] 92 % (03/22 2320)  Physical Exam: Physical Exam Constitutional:      General: Amy Murray is not in acute distress. HENT:     Head: Atraumatic.  Abdominal:     Palpations: Abdomen is soft.  Musculoskeletal:     Right shoulder: Tenderness present. Decreased range of motion.     Right elbow: Tenderness present.     Right wrist: Tenderness present.       Feet:  Feet:     Right foot:     Skin integrity: Skin integrity normal.     Left foot:     Skin integrity: Skin integrity normal.  Skin:    General: Skin is warm.  Neurological:     General: No focal deficit present.     Mental Status: Amy Murray is alert.  Psychiatric:        Attention and Perception: Attention normal.        Mood and Affect: Affect is blunt.        Speech: Speech normal.        Behavior: Behavior is cooperative.        Cognition and Memory: Cognition normal.      CBC:    BMET Recent Labs    03/20/21 0037 03/21/21 0227  NA 135 132*  K 4.6  4.5  CL 101 100  CO2 27 26  GLUCOSE 91 101*  BUN 5* 7  CREATININE 0.57 0.59  CALCIUM 8.3* 8.4*     Liver Panel  Recent Labs    03/20/21 0037 03/21/21 0227  PROT 7.0 7.6  ALBUMIN 2.1* 2.2*  AST 32 33  ALT 30 32  ALKPHOS 118 112  BILITOT 0.5 0.6       Sedimentation Rate Recent Labs    03/20/21 0037  ESRSEDRATE 110*   C-Reactive Protein Recent Labs    03/20/21 0037  CRP 9.2*    Micro Results: Recent Results (from the past 720 hour(s))  Culture, sputum-assessment     Status: None   Collection Time: 03/13/21  4:37 AM   Specimen: Expectorated Sputum  Result Value Ref Range Status   Specimen Description EXPECTORATED SPUTUM  Final   Special Requests NONE  Final   Sputum evaluation   Final    THIS SPECIMEN IS ACCEPTABLE FOR SPUTUM CULTURE Performed at [facility], [address], [place], [place] [address]    Report Status 03/14/2021 FINAL  Final  Culture, Respiratory w Gram Stain     Status: None   Collection Time: 03/13/21  4:37 AM  Result Value Ref Range Status   Specimen Description EXPECTORATED SPUTUM  Final   Special Requests NONE Reflexed from [id number]  Final   Gram Stain   Final    RARE WBC PRESENT,BOTH PMN AND MONONUCLEAR RARE GRAM POSITIVE COCCI IN PAIRS RARE GRAM NEGATIVE RODS RARE GRAM POSITIVE RODS    Culture   Final    FEW GROUP A STREP (S.PYOGENES) ISOLATED Beta hemolytic streptococci are predictably susceptible to penicillin and other beta lactams. Susceptibility testing not routinely performed. Performed at [facility], [address] [place], [place] [address]    Report Status 03/16/2021 FINAL  Final  Gastrointestinal Panel by PCR , Stool     Status: None   Collection Time: 03/13/21  1:06 PM   Specimen: Stool  Result Value Ref Range Status   Campylobacter species NOT DETECTED NOT DETECTED Final   Plesimonas shigelloides NOT DETECTED NOT DETECTED Final   Salmonella species NOT DETECTED NOT DETECTED Final   Yersinia  enterocolitica NOT DETECTED NOT DETECTED Final   Vibrio species NOT DETECTED NOT DETECTED Final   Vibrio cholerae NOT DETECTED NOT DETECTED Final   Enteroaggregative E coli (EAEC) NOT DETECTED NOT DETECTED Final   Enteropathogenic E coli (EPEC) NOT DETECTED NOT DETECTED Final   Enterotoxigenic E coli (ETEC) NOT DETECTED NOT DETECTED Final   Shiga like toxin producing E coli (STEC) NOT DETECTED NOT DETECTED Final   Shigella/Enteroinvasive E coli (EIEC) NOT DETECTED NOT DETECTED Final   Cryptosporidium NOT DETECTED NOT DETECTED Final   Cyclospora cayetanensis NOT DETECTED NOT DETECTED Final   Entamoeba histolytica NOT DETECTED NOT DETECTED Final   Giardia lamblia NOT DETECTED NOT DETECTED Final   Adenovirus F40/41 NOT DETECTED NOT DETECTED Final   Astrovirus NOT DETECTED NOT DETECTED Final   Norovirus GI/GII NOT DETECTED NOT DETECTED Final   Rotavirus A NOT DETECTED NOT DETECTED Final   Sapovirus (I, II, IV, and V) NOT DETECTED NOT DETECTED Final    Comment: Performed at [facility], [address] [place], [place] [address]  C Difficile Quick Screen w PCR reflex     Status: None   Collection Time: 03/13/21  1:06 PM   Specimen: STOOL  Result Value Ref Range Status   C Diff antigen NEGATIVE NEGATIVE Final   C Diff toxin NEGATIVE NEGATIVE Final   C Diff interpretation No C. difficile detected.  Final    Comment: Performed at [facility], [address] [place], [place] [address]  Culture, blood (routine x 2)     Status: Abnormal   Collection Time: 03/13/21  2:00 PM   Specimen: BLOOD  Result Value Ref Range Status   Specimen Description BLOOD SITE NOT SPECIFIED  Final   Special Requests   Final    BOTTLES DRAWN AEROBIC AND ANAEROBIC Blood Culture results may not be optimal due to an inadequate volume of blood received in culture bottles   Culture  Setup Time   Final    GRAM POSITIVE COCCI IN BOTH AEROBIC AND ANAEROBIC BOTTLES CRITICAL VALUE NOTED.  VALUE IS  CONSISTENT WITH PREVIOUSLY REPORTED AND CALLED VALUE.    Culture (A)  Final    GROUP A STREP (S.PYOGENES) ISOLATED SUSCEPTIBILITIES PERFORMED ON PREVIOUS CULTURE WITHIN THE LAST 5 DAYS. Performed at [facility], [address] [place], [place] [address]    Report Status 03/16/2021 FINAL  Final  Culture, blood (routine x 2)     Status: Abnormal   Collection Time: 03/13/21  2:36 PM   Specimen: BLOOD  Result Value Ref Range Status   Specimen Description BLOOD SITE NOT SPECIFIED  Final   Special Requests   Final    BOTTLES DRAWN AEROBIC AND ANAEROBIC Blood Culture results may not be optimal due to an inadequate volume of blood received in culture bottles   Culture  Setup Time   Final    GRAM POSITIVE COCCI IN CHAINS IN BOTH AEROBIC AND ANAEROBIC BOTTLES CRITICAL RESULT CALLED TO, READ BACK BY AND VERIFIED WITH: Marissa NestleHARMD THOMAS J [id number] FCP    Culture (A)  Final    GROUP A STREP (S.PYOGENES) ISOLATED HEALTH DEPARTMENT NOTIFIED Performed at [facility], [address] [place], [place] [address]    Report Status 03/16/2021 FINAL  Final   Organism ID, Bacteria GROUP A STREP (S.PYOGENES) ISOLATED  Final      Susceptibility   Group a strep (s.pyogenes) isolated - MIC*    PENICILLIN <=0.06 SENSITIVE Sensitive  CEFTRIAXONE <=0.12 SENSITIVE Sensitive     ERYTHROMYCIN <=0.12 SENSITIVE Sensitive     LEVOFLOXACIN <=0.25 SENSITIVE Sensitive     VANCOMYCIN <=0.12 SENSITIVE Sensitive     * GROUP A STREP (S.PYOGENES) ISOLATED  Blood Culture ID Panel (Reflexed)     Status: Abnormal   Collection Time: 03/13/21  2:36 PM  Result Value Ref Range Status   Enterococcus faecalis NOT DETECTED NOT DETECTED Final   Enterococcus Faecium NOT DETECTED NOT DETECTED Final   Listeria monocytogenes NOT DETECTED NOT DETECTED Final   Staphylococcus species NOT DETECTED NOT DETECTED Final   Staphylococcus aureus (BCID) NOT DETECTED NOT DETECTED Final   Staphylococcus epidermidis NOT DETECTED  NOT DETECTED Final   Staphylococcus lugdunensis NOT DETECTED NOT DETECTED Final   Streptococcus species DETECTED (A) NOT DETECTED Final    Comment: CRITICAL RESULT CALLED TO, READ BACK BY AND VERIFIED WITH: PHARMD THOMAS J [id number] FCP    Streptococcus agalactiae NOT DETECTED NOT DETECTED Final   Streptococcus pneumoniae NOT DETECTED NOT DETECTED Final   Streptococcus pyogenes DETECTED (A) NOT DETECTED Final    Comment: CRITICAL RESULT CALLED TO, READ BACK BY AND VERIFIED WITH: PHARMD THOMAS J [id number] FCP    A.calcoaceticus-baumannii NOT DETECTED NOT DETECTED Final   Bacteroides fragilis NOT DETECTED NOT DETECTED Final   Enterobacterales NOT DETECTED NOT DETECTED Final   Enterobacter cloacae complex NOT DETECTED NOT DETECTED Final   Escherichia coli NOT DETECTED NOT DETECTED Final   Klebsiella aerogenes NOT DETECTED NOT DETECTED Final   Klebsiella oxytoca NOT DETECTED NOT DETECTED Final   Klebsiella pneumoniae NOT DETECTED NOT DETECTED Final   Proteus species NOT DETECTED NOT DETECTED Final   Salmonella species NOT DETECTED NOT DETECTED Final   Serratia marcescens NOT DETECTED NOT DETECTED Final   Haemophilus influenzae NOT DETECTED NOT DETECTED Final   Neisseria meningitidis NOT DETECTED NOT DETECTED Final   Pseudomonas aeruginosa NOT DETECTED NOT DETECTED Final   Stenotrophomonas maltophilia NOT DETECTED NOT DETECTED Final   Candida albicans NOT DETECTED NOT DETECTED Final   Candida auris NOT DETECTED NOT DETECTED Final   Candida glabrata NOT DETECTED NOT DETECTED Final   Candida krusei NOT DETECTED NOT DETECTED Final   Candida parapsilosis NOT DETECTED NOT DETECTED Final   Candida tropicalis NOT DETECTED NOT DETECTED Final   Cryptococcus neoformans/gattii NOT DETECTED NOT DETECTED Final    Comment: Performed at [facility], [address] [place], [place] [address]  Resp Panel by RT-PCR (Flu A&B, Covid) Nasopharyngeal Swab     Status: None   Collection Time:  03/13/21  2:40 PM   Specimen: Nasopharyngeal Swab; Nasopharyngeal(NP) swabs in vial transport medium  Result Value Ref Range Status   SARS Coronavirus 2 by RT PCR NEGATIVE NEGATIVE Final    Comment: (NOTE) SARS-CoV-2 target nucleic acids are NOT DETECTED.  The SARS-CoV-2 RNA is generally detectable in upper respiratory specimens during the acute phase of infection. The lowest concentration of SARS-CoV-2 viral copies this assay can detect is 138 copies/mL. A negative result does not preclude SARS-Cov-2 infection and should not be used as the sole basis for treatment or other patient management decisions. A negative result may occur with  improper specimen collection/handling, submission of specimen other than nasopharyngeal swab, presence of viral mutation(s) within the areas targeted by this assay, and inadequate number of viral copies(<138 copies/mL). A negative result must be combined with clinical observations, patient history, and epidemiological information. The expected result is Negative.  Fact Sheet for Patients:  BloggerCourse.com  Fact Sheet for Healthcare Providers:  SeriousBroker.it  This test is no t yet approved or cleared by the [place] FDA and  has been authorized for detection and/or diagnosis of SARS-CoV-2 by FDA under an Emergency Use Authorization (EUA). This EUA will remain  in effect (meaning this test can be used) for the duration of the COVID-19 declaration under Section 564(b)(1) of the Act, 21 U.S.C.section 360bbb-3(b)(1), unless the authorization is terminated  or revoked sooner.       Influenza A by PCR NEGATIVE NEGATIVE Final   Influenza B by PCR NEGATIVE NEGATIVE Final    Comment: (NOTE) The Xpert Xpress SARS-CoV-2/FLU/RSV plus assay is intended as an aid in the diagnosis of influenza from Nasopharyngeal swab specimens and should not be used as a sole basis for treatment. Nasal washings  and aspirates are unacceptable for Xpert Xpress SARS-CoV-2/FLU/RSV testing.  Fact Sheet for Patients: BloggerCourse.com  Fact Sheet for Healthcare Providers: SeriousBroker.it  This test is not yet approved or cleared by the [place] FDA and has been authorized for detection and/or diagnosis of SARS-CoV-2 by FDA under an Emergency Use Authorization (EUA). This EUA will remain in effect (meaning this test can be used) for the duration of the COVID-19 declaration under Section 564(b)(1) of the Act, 21 U.S.C. section 360bbb-3(b)(1), unless the authorization is terminated or revoked.  Performed at [facility], [address], [place], [place] [address]   Culture, blood (routine x 2)     Status: None (Preliminary result)   Collection Time: 03/17/21 12:10 PM   Specimen: BLOOD  Result Value Ref Range Status   Specimen Description BLOOD RIGHT ANTECUBITAL  Final   Special Requests   Final    BOTTLES DRAWN AEROBIC AND ANAEROBIC Blood Culture adequate volume   Culture   Final    NO GROWTH 4 DAYS Performed at [facility], [address] [place], [place] [address]    Report Status PENDING  Incomplete  Culture, blood (routine x 2)     Status: None (Preliminary result)   Collection Time: 03/17/21 12:24 PM   Specimen: BLOOD  Result Value Ref Range Status   Specimen Description BLOOD RIGHT ANTECUBITAL  Final   Special Requests   Final    BOTTLES DRAWN AEROBIC ONLY Blood Culture adequate volume   Culture   Final    NO GROWTH 4 DAYS Performed at [facility], [address] [place], [place] [address]    Report Status PENDING  Incomplete    Studies/Results: MR Shoulder Right W Wo Contrast  Result Date: 03/20/2021 CLINICAL DATA:  Group a Streptococcus bacteremia. Shoulder pain. Evaluate for possible septic arthritis. EXAM: MRI OF THE RIGHT SHOULDER WITHOUT AND WITH CONTRAST TECHNIQUE: Multiplanar, multisequence MR  imaging of the right shoulder was performed before and after the administration of intravenous contrast. CONTRAST:  7mL GADAVIST GADOBUTROL 1 MMOL/ML IV SOLN COMPARISON:  None. FINDINGS: Rotator cuff: Minimal rotator cuff tendinopathy/tendinosis. Mild bursal surface fraying and fibrillation. No rotator cuff tear. Muscles: Mild edema like signal abnormality mainly involving the infraspinatus muscle but also to a lesser extent the supraspinatus muscle. The other shoulder musculature appears normal. Findings could be due to a nontraumatic neurogenic cause such as [place] Turner syndrome. No muscle mass or abnormal muscle enhancement. No findings for pyomyositis. Biceps long head:  Intact Acromioclavicular Joint:  Normal.  No findings for septic arthritis. Glenohumeral Joint: Normal articular cartilage. No joint effusion. No findings for septic arthritis. Labrum:  Intact Bones:  No findings suspicious for osteomyelitis. Other: Moderate subacromial/subdeltoid bursitis with diffuse inflammation/enhancement but I do not see any definite rim enhancing fluid collection to suggest septic bursitis. If symptoms persist or worsen bursal aspiration may be indicated. IMPRESSION: 1. Moderate subacromial/subdeltoid bursitis with diffuse inflammation/enhancement but I do not see any definite rim enhancing fluid collection to suggest septic bursitis. 2. Minimal rotator cuff tendinopathy/tendinosis. No rotator cuff tear. 3. No findings for septic arthritis or osteomyelitis involving the [facility] joint or glenohumeral joint. 4. Mild edema like signal abnormality in the infraspinatus muscle but also to a lesser extent the supraspinatus muscle. Findings could be due to a nontraumatic neurogenic cause such as [place] Turner syndrome. 5. Intact long head biceps tendon and glenoid labrum. Electronically Signed   By: Rudie Meyer M.D.   On: 03/20/2021 10:08      Assessment/Plan:  INTERVAL HISTORY: Patient's pain is improved at all the  different sites including shoulder elbow wrist and foot   Principal Problem:   Sepsis (HCC) Active Problems:   Bipolar I disorder (HCC)   PTSD (post-traumatic stress disorder)   Attention deficit hyperactivity disorder (ADHD), predominantly hyperactive type    Amy Murray is a 30 y.o. female with PTSD ADHD bipolar disorder who was admitted with nausea vomiting abdominal pain fevers and new pain in her wrist ultimately elbow shoulder and foot found to have group A streptococcal bacteremia.  Her pain is improved and Amy Murray is less tender on exam today as well.  1.  Group A streptococcus bacteremia:  Concern for septic shoulder.  MRI does not show this but shows some moderate subacromial and subdeltoid bursitis with diffuse inflammation some rotator cuff tendinopathy and some mild edema in the infraspinatus muscle and supraspinatus muscle.  It is possible Amy Murray could have myositis that will later declare itself to be pyomyositis.  I do not think this point is worth imaging or other joints because I do not think we will find something at this point that would be intervened upon by a surgeon.  I do think Amy Murray needs aggressive imaging of her heart valves with a transesophageal echocardiogram.  We discussed IV antibiotics at home but I am not confident Amy Murray will be a suitable candidate.  One of the first question Amy Murray asked was whether Amy Murray could have sex with the PICC line in place  We are arranging for her to be able to leave with tedezolid followed by linezolid  #2 HCV positive, check RNA and genotype, Concerned Amy Murray either has used IVD or has had higher risk sex that Amy Murray endorses. I suppose Tattoo could also be a means Amy Murray has + test  #3 PrEP: discussed  preexposure prophylaxis yesterday  .  Meribeth Vitug has an appointment on 04/02/2021 at 1030 AM with Dr. Daiva Eves  The [facility] is located in the [facility] at  [address] [place] in  [place].  [address], which is located to the left of the elevators.  Phone: [phone]  Fax: [phone]  [email or web address]  Amy Murray should arrive 15 minutes prior to her appointment.   LOS: 8 days   Acey Lav 03/21/2021, 10:49 AM  We spent greater than 35 minutes with the patient including greater than 50% of time in face to face counsel of the patient and in coordination of her care.

## 2021-03-21 NOTE — Anesthesia Preprocedure Evaluation (Signed)
Anesthesia Evaluation  Patient identified by MRN, date of birth, ID band Patient awake    Reviewed: Allergy & Precautions, NPO status , Patient's Chart, lab work & pertinent test results  Airway Mallampati: I       Dental no notable dental hx.    Pulmonary neg pulmonary ROS,    Pulmonary exam normal        Cardiovascular Normal cardiovascular exam     Neuro/Psych PSYCHIATRIC DISORDERS Anxiety Bipolar Disorder negative neurological ROS     GI/Hepatic negative GI ROS, Neg liver ROS,   Endo/Other  negative endocrine ROS  Renal/GU negative Renal ROS  negative genitourinary   Musculoskeletal negative musculoskeletal ROS (+)   Abdominal Normal abdominal exam  (+)   Peds  Hematology  (+) Blood dyscrasia, anemia ,   Anesthesia Other Findings   Reproductive/Obstetrics                             Anesthesia Physical Anesthesia Plan  ASA: II  Anesthesia Plan: MAC   Post-op Pain Management:    Induction:   PONV Risk Score and Plan: Propofol infusion  Airway Management Planned: Natural Airway and Mask  Additional Equipment: TEE  Intra-op Plan:   Post-operative Plan:   Informed Consent: I have reviewed the patients History and Physical, chart, labs and discussed the procedure including the risks, benefits and alternatives for the proposed anesthesia with the patient or authorized representative who has indicated his/her understanding and acceptance.     Dental advisory given  Plan Discussed with: CRNA  Anesthesia Plan Comments: ( 1. No obvious vegetation. Exam is not adequate to r/o however. Would  require TEE if clinically indicated.  2. Left ventricular ejection fraction, by estimation, is 65 to 70%. The  left ventricle has normal function. The left ventricle has no regional  wall motion abnormalities. Left ventricular diastolic parameters were  normal.  3. Right ventricular  systolic function is normal. The right ventricular  size is normal.  4. The mitral valve is normal in structure. Mild mitral valve  regurgitation.  5. The aortic valve is normal in structure. Aortic valve regurgitation is  not visualized.   FINDINGS  Left Ventricle: Left ventricular ejection fraction, by estimation, is 65  to 70%. The left ventricle has normal function. The left ventricle has no  regional wall motion abnormalities. The left ventricular internal cavity  size was normal in size. There is  no left ventricular hypertrophy. Left ventricular diastolic parameters  were normal)        Anesthesia Quick Evaluation

## 2021-03-21 NOTE — Transfer of Care (Signed)
Immediate Anesthesia Transfer of Care Note  Patient: Amy Murray  Procedure(s) Performed: TRANSESOPHAGEAL ECHOCARDIOGRAM (TEE) (N/A )  Patient Location: Endoscopy Unit  Anesthesia Type:MAC  Level of Consciousness: drowsy  Airway & Oxygen Therapy: Patient Spontanous Breathing and Patient connected to nasal cannula oxygen  Post-op Assessment: Report given to RN and Post -op Vital signs reviewed and stable  Post vital signs: Reviewed and stable  Last Vitals:  Vitals Value Taken Time  BP 83/51 03/21/21 1155  Temp    Pulse 103 03/21/21 1155  Resp 27 03/21/21 1155  SpO2 100 % 03/21/21 1155  Vitals shown include unvalidated device data.  Last Pain:  Vitals:   03/21/21 1115  TempSrc: Oral  PainSc: 0-No pain      Patients Stated Pain Goal: 0 (03/19/21 4825)  Complications: No complications documented.

## 2021-03-21 NOTE — H&P (View-Only) (Signed)
° ° ° ° ° ° °Subjective: °She is feeling much better today ° ° °Antibiotics:  °Anti-infectives (From admission, onward)  ° Start     Dose/Rate Route Frequency Ordered Stop  ° 04/02/21 0000  linezolid (ZYVOX) 600 MG tablet       ° 600 mg Oral 2 times daily 03/21/21 0955 04/14/21 2359  ° 03/27/21 0000  Tedizolid Phosphate 200 MG TABS       ° 200 mg Oral Daily 03/21/21 0955 04/02/21 2359  ° 03/21/21 0000  Tedizolid Phosphate 200 MG TABS       ° 200 mg Oral Daily 03/21/21 0955 03/26/21 2359  ° 03/15/21 1115  penicillin G potassium 12 Million Units in dextrose 5 % 500 mL continuous infusion       ° 12 Million Units °41.7 mL/hr over 12 Hours Intravenous Every 12 hours 03/15/21 1015    ° 03/14/21 1600  penicillin G potassium 4 Million Units in dextrose 5 % 250 mL IVPB  Status:  Discontinued       ° 4 Million Units °250 mL/hr over 60 Minutes Intravenous Every 4 hours 03/14/21 1335 03/14/21 1338  ° 03/14/21 1600  penicillin G potassium 12 Million Units in dextrose 5 % 500 mL continuous infusion  Status:  Discontinued       ° 12 Million Units °41.7 mL/hr over 12 Hours Intravenous Every 12 hours 03/14/21 1340 03/15/21 1015  ° 03/14/21 0800  cefTRIAXone (ROCEPHIN) 2 g in sodium chloride 0.9 % 100 mL IVPB  Status:  Discontinued       °"And" Linked Group Details  ° 2 g °200 mL/hr over 30 Minutes Intravenous Every 24 hours 03/13/21 1738 03/14/21 1335  ° 03/13/21 2200  metroNIDAZOLE (FLAGYL) IVPB 500 mg  Status:  Discontinued       °"And" Linked Group Details  ° 500 mg °100 mL/hr over 60 Minutes Intravenous Every 8 hours 03/13/21 1738 03/14/21 1335  ° 03/13/21 1130  cefTRIAXone (ROCEPHIN) 2 g in sodium chloride 0.9 % 100 mL IVPB       °"And" Linked Group Details  ° 2 g °200 mL/hr over 30 Minutes Intravenous  Once 03/13/21 1119 03/13/21 1617  ° 03/13/21 1130  metroNIDAZOLE (FLAGYL) IVPB 500 mg       °"And" Linked Group Details  ° 500 mg °100 mL/hr over 60 Minutes Intravenous  Once 03/13/21 1119 03/13/21 1715  °   ° ° °Medications: °Scheduled Meds: °• divalproex  500 mg Oral BID  °• enoxaparin (LOVENOX) injection  40 mg Subcutaneous Q24H  °• feeding supplement  237 mL Oral TID BM  °• multivitamin with minerals  1 tablet Oral Daily  °• naproxen  500 mg Oral BID WC  ° °Continuous Infusions: °• penicillin g continuous IV infusion 12 Million Units (03/20/21 2323)  ° °PRN Meds:.acetaminophen, diclofenac Sodium, traZODone ° ° ° °Objective: °Weight change:  ° °Intake/Output Summary (Last 24 hours) at 03/21/2021 1049 °Last data filed at 03/21/2021 0654 °Gross per 24 hour  °Intake --  °Output 600 ml  °Net -600 ml  ° °Blood pressure 131/69, pulse 88, temperature 98.5 °F (36.9 °C), temperature source Oral, resp. rate 18, height 5' (1.524 m), weight 72.3 kg, last menstrual period 03/06/2021, SpO2 92 %. °Temp:  [98.5 °F (36.9 °C)-99 °F (37.2 °C)] 98.5 °F (36.9 °C) (03/22 2320) °Pulse Rate:  [88-91] 88 (03/22 1946) °Resp:  [16-18] 18 (03/22 2320) °BP: (115-131)/(64-74) 131/69 (03/22 2320) °SpO2:  [92 %-100 %] 92 % (03/22 2320) ° °  Physical Exam: °Physical Exam °Constitutional:   °   General: She is not in acute distress. °HENT:  °   Head: Atraumatic.  °Abdominal:  °   Palpations: Abdomen is soft.  °Musculoskeletal:  °   Right shoulder: Tenderness present. Decreased range of motion.  °   Right elbow: Tenderness present.  °   Right wrist: Tenderness present.  °     Feet: ° °Feet:  °   Right foot:  °   Skin integrity: Skin integrity normal.  °   Left foot:  °   Skin integrity: Skin integrity normal.  °Skin: °   General: Skin is warm.  °Neurological:  °   General: No focal deficit present.  °   Mental Status: She is alert.  °Psychiatric:     °   Attention and Perception: Attention normal.     °   Mood and Affect: Affect is blunt.     °   Speech: Speech normal.     °   Behavior: Behavior is cooperative.     °   Cognition and Memory: Cognition normal.  ° °  ° °CBC: ° ° ° °BMET °Recent Labs  °  03/20/21 °0037 03/21/21 °0227  °NA 135 132*  °K 4.6  4.5  °CL 101 100  °CO2 27 26  °GLUCOSE 91 101*  °BUN 5* 7  °CREATININE 0.57 0.59  °CALCIUM 8.3* 8.4*  ° ° ° °Liver Panel ° °Recent Labs  °  03/20/21 °0037 03/21/21 °0227  °PROT 7.0 7.6  °ALBUMIN 2.1* 2.2*  °AST 32 33  °ALT 30 32  °ALKPHOS 118 112  °BILITOT 0.5 0.6  ° ° ° ° ° °Sedimentation Rate °Recent Labs  °  03/20/21 °0037  °ESRSEDRATE 110*  ° °C-Reactive Protein °Recent Labs  °  03/20/21 °0037  °CRP 9.2*  ° ° °Micro Results: °Recent Results (from the past 720 hour(s))  °Culture, sputum-assessment     Status: None  ° Collection Time: 03/13/21  4:37 AM  ° Specimen: Expectorated Sputum  °Result Value Ref Range Status  ° Specimen Description EXPECTORATED SPUTUM  Final  ° Special Requests NONE  Final  ° Sputum evaluation   Final  °  THIS SPECIMEN IS ACCEPTABLE FOR SPUTUM CULTURE °Performed at [facility], [address] [place], [place] [address] °  ° Report Status 03/14/2021 FINAL  Final  °Culture, Respiratory w Gram Stain     Status: None  ° Collection Time: 03/13/21  4:37 AM  °Result Value Ref Range Status  ° Specimen Description EXPECTORATED SPUTUM  Final  ° Special Requests NONE Reflexed from [id number]  Final  ° Gram Stain   Final  °  RARE WBC PRESENT,BOTH PMN AND MONONUCLEAR °RARE GRAM POSITIVE COCCI IN PAIRS °RARE GRAM NEGATIVE RODS °RARE GRAM POSITIVE RODS °  ° Culture   Final  °  FEW GROUP A STREP (S.PYOGENES) ISOLATED °Beta hemolytic streptococci are predictably susceptible to penicillin and other beta lactams. Susceptibility testing not routinely performed. °Performed at [facility], [address] , [place] [address] °  ° Report Status 03/16/2021 FINAL  Final  °Gastrointestinal Panel by PCR , Stool     Status: None  ° Collection Time: 03/13/21  1:06 PM  ° Specimen: Stool  °Result Value Ref Range Status  ° Campylobacter species NOT DETECTED NOT DETECTED Final  ° Plesimonas shigelloides NOT DETECTED NOT DETECTED Final  ° Salmonella species NOT DETECTED NOT DETECTED Final  ° Yersinia    enterocolitica NOT DETECTED NOT DETECTED Final  ° Vibrio species NOT DETECTED NOT DETECTED Final  ° Vibrio cholerae NOT DETECTED NOT DETECTED Final  ° Enteroaggregative E coli (EAEC) NOT DETECTED NOT DETECTED Final  ° Enteropathogenic E coli (EPEC) NOT DETECTED NOT DETECTED Final  ° Enterotoxigenic E coli (ETEC) NOT DETECTED NOT DETECTED Final  ° Shiga like toxin producing E coli (STEC) NOT DETECTED NOT DETECTED Final  ° Shigella/Enteroinvasive E coli (EIEC) NOT DETECTED NOT DETECTED Final  ° Cryptosporidium NOT DETECTED NOT DETECTED Final  ° Cyclospora cayetanensis NOT DETECTED NOT DETECTED Final  ° Entamoeba histolytica NOT DETECTED NOT DETECTED Final  ° Giardia lamblia NOT DETECTED NOT DETECTED Final  ° Adenovirus F40/41 NOT DETECTED NOT DETECTED Final  ° Astrovirus NOT DETECTED NOT DETECTED Final  ° Norovirus GI/GII NOT DETECTED NOT DETECTED Final  ° Rotavirus A NOT DETECTED NOT DETECTED Final  ° Sapovirus (I, II, IV, and V) NOT DETECTED NOT DETECTED Final  °  Comment: Performed at [facility], [address] [place], [place] [address]  °C Difficile Quick Screen w PCR reflex     Status: None  ° Collection Time: 03/13/21  1:06 PM  ° Specimen: STOOL  °Result Value Ref Range Status  ° C Diff antigen NEGATIVE NEGATIVE Final  ° C Diff toxin NEGATIVE NEGATIVE Final  ° C Diff interpretation No C. difficile detected.  Final  °  Comment: Performed at [facility], [address] [place], [place] [address]  °Culture, blood (routine x 2)     Status: Abnormal  ° Collection Time: 03/13/21  2:00 PM  ° Specimen: BLOOD  °Result Value Ref Range Status  ° Specimen Description BLOOD SITE NOT SPECIFIED  Final  ° Special Requests   Final  °  BOTTLES DRAWN AEROBIC AND ANAEROBIC Blood Culture results may not be optimal due to an inadequate volume of blood received in culture bottles  ° Culture  Setup Time   Final  °  GRAM POSITIVE COCCI °IN BOTH AEROBIC AND ANAEROBIC BOTTLES °CRITICAL VALUE NOTED.  VALUE IS  CONSISTENT WITH PREVIOUSLY REPORTED AND CALLED VALUE. °  ° Culture (A)  Final  °  GROUP A STREP (S.PYOGENES) ISOLATED °SUSCEPTIBILITIES PERFORMED ON PREVIOUS CULTURE WITHIN THE LAST 5 DAYS. °Performed at [facility], [address] [place], [place] [address] °  ° Report Status 03/16/2021 FINAL  Final  °Culture, blood (routine x 2)     Status: Abnormal  ° Collection Time: 03/13/21  2:36 PM  ° Specimen: BLOOD  °Result Value Ref Range Status  ° Specimen Description BLOOD SITE NOT SPECIFIED  Final  ° Special Requests   Final  °  BOTTLES DRAWN AEROBIC AND ANAEROBIC Blood Culture results may not be optimal due to an inadequate volume of blood received in culture bottles  ° Culture  Setup Time   Final  °  GRAM POSITIVE COCCI IN CHAINS °IN BOTH AEROBIC AND ANAEROBIC BOTTLES °CRITICAL RESULT CALLED TO, READ BACK BY AND VERIFIED WITH: PHARMD THOMAS J [id number] FCP °  ° Culture (A)  Final  °  GROUP A STREP (S.PYOGENES) ISOLATED °HEALTH DEPARTMENT NOTIFIED °Performed at [facility], [address] [place], [place] [address] °  ° Report Status 03/16/2021 FINAL  Final  ° Organism ID, Bacteria GROUP A STREP (S.PYOGENES) ISOLATED  Final  °    Susceptibility  ° Group a strep (s.pyogenes) isolated - MIC*  °  PENICILLIN <=0.06 SENSITIVE Sensitive   °    CEFTRIAXONE <=0.12 SENSITIVE Sensitive   °  ERYTHROMYCIN <=0.12 SENSITIVE Sensitive   °  LEVOFLOXACIN <=0.25 SENSITIVE Sensitive   °  VANCOMYCIN <=0.12 SENSITIVE Sensitive   °  * GROUP A STREP (S.PYOGENES) ISOLATED  °Blood Culture ID Panel (Reflexed)     Status: Abnormal  ° Collection Time: 03/13/21  2:36 PM  °Result Value Ref Range Status  ° Enterococcus faecalis NOT DETECTED NOT DETECTED Final  ° Enterococcus Faecium NOT DETECTED NOT DETECTED Final  ° Listeria monocytogenes NOT DETECTED NOT DETECTED Final  ° Staphylococcus species NOT DETECTED NOT DETECTED Final  ° Staphylococcus aureus (BCID) NOT DETECTED NOT DETECTED Final  ° Staphylococcus epidermidis NOT DETECTED  NOT DETECTED Final  ° Staphylococcus lugdunensis NOT DETECTED NOT DETECTED Final  ° Streptococcus species DETECTED (A) NOT DETECTED Final  °  Comment: CRITICAL RESULT CALLED TO, READ BACK BY AND VERIFIED WITH: °PHARMD THOMAS J [id number] [id number] FCP °  ° Streptococcus agalactiae NOT DETECTED NOT DETECTED Final  ° Streptococcus pneumoniae NOT DETECTED NOT DETECTED Final  ° Streptococcus pyogenes DETECTED (A) NOT DETECTED Final  °  Comment: CRITICAL RESULT CALLED TO, READ BACK BY AND VERIFIED WITH: °PHARMD THOMAS J 0951 [id number] FCP °  ° A.calcoaceticus-baumannii NOT DETECTED NOT DETECTED Final  ° Bacteroides fragilis NOT DETECTED NOT DETECTED Final  ° Enterobacterales NOT DETECTED NOT DETECTED Final  ° Enterobacter cloacae complex NOT DETECTED NOT DETECTED Final  ° Escherichia coli NOT DETECTED NOT DETECTED Final  ° Klebsiella aerogenes NOT DETECTED NOT DETECTED Final  ° Klebsiella oxytoca NOT DETECTED NOT DETECTED Final  ° Klebsiella pneumoniae NOT DETECTED NOT DETECTED Final  ° Proteus species NOT DETECTED NOT DETECTED Final  ° Salmonella species NOT DETECTED NOT DETECTED Final  ° Serratia marcescens NOT DETECTED NOT DETECTED Final  ° Haemophilus influenzae NOT DETECTED NOT DETECTED Final  ° Neisseria meningitidis NOT DETECTED NOT DETECTED Final  ° Pseudomonas aeruginosa NOT DETECTED NOT DETECTED Final  ° Stenotrophomonas maltophilia NOT DETECTED NOT DETECTED Final  ° Candida albicans NOT DETECTED NOT DETECTED Final  ° Candida auris NOT DETECTED NOT DETECTED Final  ° Candida glabrata NOT DETECTED NOT DETECTED Final  ° Candida krusei NOT DETECTED NOT DETECTED Final  ° Candida parapsilosis NOT DETECTED NOT DETECTED Final  ° Candida tropicalis NOT DETECTED NOT DETECTED Final  ° Cryptococcus neoformans/gattii NOT DETECTED NOT DETECTED Final  °  Comment: Performed at [facility], [address] [place], [place] [address]  °Resp Panel by RT-PCR (Flu A&B, Covid) Nasopharyngeal Swab     Status: None  ° Collection Time:  03/13/21  2:40 PM  ° Specimen: Nasopharyngeal Swab; Nasopharyngeal(NP) swabs in vial transport medium  °Result Value Ref Range Status  ° SARS Coronavirus 2 by RT PCR NEGATIVE NEGATIVE Final  °  Comment: (NOTE) °SARS-CoV-2 target nucleic acids are NOT DETECTED. ° °The SARS-CoV-2 RNA is generally detectable in upper respiratory °specimens during the acute phase of infection. The lowest °concentration of SARS-CoV-2 viral copies this assay can detect is °138 copies/mL. A negative result does not preclude SARS-Cov-2 °infection and should not be used as the sole basis for treatment or °other patient management decisions. A negative result may occur with  °improper specimen collection/handling, submission of specimen other °than nasopharyngeal swab, presence of viral mutation(s) within the °areas targeted by this assay, and inadequate number of viral °copies(<138 copies/mL). A negative result must be combined with °clinical observations, patient history, and epidemiological °information. The expected result is Negative. ° °Fact Sheet for Patients:  °  https://www.fda.gov/media/152166/download ° °Fact Sheet for Healthcare Providers:  °https://www.fda.gov/media/152162/download ° °This test is no t yet approved or cleared by the [place] FDA and  °has been authorized for detection and/or diagnosis of SARS-CoV-2 by °FDA under an Emergency Use Authorization (EUA). This EUA will remain  °in effect (meaning this test can be used) for the duration of the °COVID-19 declaration under Section 564(b)(1) of the Act, 21 °U.S.C.section 360bbb-3(b)(1), unless the authorization is terminated  °or revoked sooner.  ° ° °  ° Influenza A by PCR NEGATIVE NEGATIVE Final  ° Influenza B by PCR NEGATIVE NEGATIVE Final  °  Comment: (NOTE) °The Xpert Xpress SARS-CoV-2/FLU/RSV plus assay is intended as an aid °in the diagnosis of influenza from Nasopharyngeal swab specimens and °should not be used as a sole basis for treatment. Nasal washings  and °aspirates are unacceptable for Xpert Xpress SARS-CoV-2/FLU/RSV °testing. ° °Fact Sheet for Patients: °https://www.fda.gov/media/152166/download ° °Fact Sheet for Healthcare Providers: °https://www.fda.gov/media/152162/download ° °This test is not yet approved or cleared by the [place] FDA and °has been authorized for detection and/or diagnosis of SARS-CoV-2 by °FDA under an Emergency Use Authorization (EUA). This EUA will remain °in effect (meaning this test can be used) for the duration of the °COVID-19 declaration under Section 564(b)(1) of the Act, 21 U.S.C. °section 360bbb-3(b)(1), unless the authorization is terminated or °revoked. ° °Performed at [facility], [address] [place], [place] °[address] °  °Culture, blood (routine x 2)     Status: None (Preliminary result)  ° Collection Time: 03/17/21 12:10 PM  ° Specimen: BLOOD  °Result Value Ref Range Status  ° Specimen Description BLOOD RIGHT ANTECUBITAL  Final  ° Special Requests   Final  °  BOTTLES DRAWN AEROBIC AND ANAEROBIC Blood Culture adequate volume  ° Culture   Final  °  NO GROWTH 4 DAYS °Performed at [facility], [address] [place], [place] [address] °  ° Report Status PENDING  Incomplete  °Culture, blood (routine x 2)     Status: None (Preliminary result)  ° Collection Time: 03/17/21 12:24 PM  ° Specimen: BLOOD  °Result Value Ref Range Status  ° Specimen Description BLOOD RIGHT ANTECUBITAL  Final  ° Special Requests   Final  °  BOTTLES DRAWN AEROBIC ONLY Blood Culture adequate volume  ° Culture   Final  °  NO GROWTH 4 DAYS °Performed at [facility], [address] [place],  [address] °  ° Report Status PENDING  Incomplete  ° ° °Studies/Results: °MR Shoulder Right W Wo Contrast ° °Result Date: 03/20/2021 °CLINICAL DATA:  Group a Streptococcus bacteremia. Shoulder pain. Evaluate for possible septic arthritis. EXAM: MRI OF THE RIGHT SHOULDER WITHOUT AND WITH CONTRAST TECHNIQUE: Multiplanar, multisequence MR  imaging of the right shoulder was performed before and after the administration of intravenous contrast. CONTRAST:  7mL GADAVIST GADOBUTROL 1 MMOL/ML IV SOLN COMPARISON:  None. FINDINGS: Rotator cuff: Minimal rotator cuff tendinopathy/tendinosis. Mild bursal surface fraying and fibrillation. No rotator cuff tear. Muscles: Mild edema like signal abnormality mainly involving the infraspinatus muscle but also to a lesser extent the supraspinatus muscle. The other shoulder musculature appears normal. Findings could be due to a nontraumatic neurogenic cause such as [place] Turner syndrome. No muscle mass or abnormal muscle enhancement. No findings for pyomyositis. Biceps long head:  Intact Acromioclavicular Joint:  Normal.  No findings for septic arthritis. Glenohumeral Joint: Normal articular cartilage. No joint effusion. No findings for septic arthritis. Labrum:    Intact Bones:  No findings suspicious for osteomyelitis. Other: Moderate subacromial/subdeltoid bursitis with diffuse inflammation/enhancement but I do not see any definite rim enhancing fluid collection to suggest septic bursitis. If symptoms persist or worsen bursal aspiration may be indicated. IMPRESSION: 1. Moderate subacromial/subdeltoid bursitis with diffuse inflammation/enhancement but I do not see any definite rim enhancing fluid collection to suggest septic bursitis. 2. Minimal rotator cuff tendinopathy/tendinosis. No rotator cuff tear. 3. No findings for septic arthritis or osteomyelitis involving the AC joint or glenohumeral joint. 4. Mild edema like signal abnormality in the infraspinatus muscle but also to a lesser extent the supraspinatus muscle. Findings could be due to a nontraumatic neurogenic cause such as [place] Turner syndrome. 5. Intact long head biceps tendon and glenoid labrum. Electronically Signed   By: P.  Gallerani M.D.   On: 03/20/2021 10:08  ° ° ° ° °Assessment/Plan: ° °INTERVAL HISTORY: Patient's pain is improved at all the  different sites including shoulder elbow wrist and foot ° ° °Principal Problem: °  Sepsis (HCC) °Active Problems: °  Bipolar I disorder (HCC) °  PTSD (post-traumatic stress disorder) °  Attention deficit hyperactivity disorder (ADHD), predominantly hyperactive type ° ° ° °Amy Murray is a 29 y.o. female with PTSD ADHD bipolar disorder who was admitted with nausea vomiting abdominal pain fevers and new pain in her wrist ultimately elbow shoulder and foot found to have group A streptococcal bacteremia.  Her pain is improved and she is less tender on exam today as well. ° °1.  Group A streptococcus bacteremia: ° °Concern for septic shoulder.  MRI does not show this but shows some moderate subacromial and subdeltoid bursitis with diffuse inflammation some rotator cuff tendinopathy and some mild edema in the infraspinatus muscle and supraspinatus muscle. ° °It is possible she could have myositis that will later declare itself to be pyomyositis. ° °I do not think this point is worth imaging or other joints because I do not think we will find something at this point that would be intervened upon by a surgeon. ° °I do think she needs aggressive imaging of her heart valves with a transesophageal echocardiogram. ° °We discussed IV antibiotics at home but I am not confident she will be a suitable candidate.  One of the first question she asked was whether she could have sex with the PICC line in place ° °We are arranging for her to be able to leave with tedezolid followed by linezolid ° °#2 HCV positive, check RNA and genotype, Concerned she either has used IVD or has had higher risk sex that she endorses. I suppose Tattoo could also be a means she has + test ° °#3 PrEP: discussed  preexposure prophylaxis yesterday ° °. ° °Amy Murray has an appointment on 04/02/2021 at 1030 AM with Dr. Van Murray ° °[facility] is located in the [facility] at ° °[address] in  [place]. ° °[address], which is located to the left of the elevators. ° °Phone: [phone] ° °Fax: [phone] ° °[email or web address] ° °She should arrive 15 minutes prior to her appointment. ° ° LOS: 8 days  ° °Amy Murray °03/21/2021, 10:49 AM ° °We spent greater than 35 minutes with the patient including greater than 50% of time in face to face counsel of the patient and in coordination of her care. ° °

## 2021-03-21 NOTE — Anesthesia Postprocedure Evaluation (Signed)
Anesthesia Post Note  Patient: Amy Murray  Procedure(s) Performed: TRANSESOPHAGEAL ECHOCARDIOGRAM (TEE) (N/A )     Patient location during evaluation: Endoscopy Anesthesia Type: MAC Level of consciousness: sedated Pain management: pain level controlled Vital Signs Assessment: post-procedure vital signs reviewed and stable Respiratory status: spontaneous breathing Cardiovascular status: stable Postop Assessment: no apparent nausea or vomiting Anesthetic complications: no   No complications documented.  Last Vitals:  Vitals:   03/21/21 1156 03/21/21 1205  BP: (!) 87/51 (!) 95/56  Pulse: (!) 103 98  Resp: (!) 27 (!) 23  Temp: 36.9 C   SpO2: 100% 100%    Last Pain:  Vitals:   03/21/21 1156  TempSrc: Axillary  PainSc: [place]

## 2021-03-22 LAB — CULTURE, BLOOD (ROUTINE X 2)
Culture: NO GROWTH
Culture: NO GROWTH
Special Requests: ADEQUATE
Special Requests: ADEQUATE

## 2021-03-23 ENCOUNTER — Encounter (HOSPITAL_COMMUNITY): Payer: Self-pay | Admitting: Cardiovascular Disease

## 2021-03-23 LAB — HEPATITIS C GENOTYPE

## 2021-03-25 LAB — CRYOGLOBULIN

## 2021-03-28 MED FILL — SIVEXTRO 200 MG TABS: 200 | 6 days supply | Qty: 6 | Fill #0

## 2021-03-30 ENCOUNTER — Ambulatory Visit (INDEPENDENT_AMBULATORY_CARE_PROVIDER_SITE_OTHER): Payer: Self-pay | Admitting: Infectious Disease

## 2021-03-30 ENCOUNTER — Encounter: Payer: Self-pay | Admitting: Infectious Disease

## 2021-03-30 ENCOUNTER — Other Ambulatory Visit: Payer: Self-pay

## 2021-03-30 VITALS — BP 119/84 | HR 80 | Temp 98.3°F | Wt 147.0 lb

## 2021-03-30 DIAGNOSIS — N179 Acute kidney failure, unspecified: Secondary | ICD-10-CM

## 2021-03-30 DIAGNOSIS — A400 Sepsis due to streptococcus, group A: Secondary | ICD-10-CM

## 2021-03-30 DIAGNOSIS — F4310 Post-traumatic stress disorder, unspecified: Secondary | ICD-10-CM

## 2021-03-30 DIAGNOSIS — R6520 Severe sepsis without septic shock: Secondary | ICD-10-CM

## 2021-03-30 DIAGNOSIS — F319 Bipolar disorder, unspecified: Secondary | ICD-10-CM

## 2021-03-30 HISTORY — DX: Sepsis due to Streptococcus, group A: A40.0

## 2021-03-30 LAB — CBC WITH DIFFERENTIAL/PLATELET
Absolute Monocytes: 772 cells/uL (ref 200–950)
Basophils Absolute: 37 cells/uL (ref 0–200)
Basophils Relative: 0.4 %
Eosinophils Absolute: 19 cells/uL (ref 15–500)
Eosinophils Relative: 0.2 %
HCT: 29 % — ABNORMAL LOW (ref 35.0–45.0)
Hemoglobin: 8.8 g/dL — ABNORMAL LOW (ref 11.7–15.5)
Lymphs Abs: 3432 cells/uL (ref 850–3900)
MCH: 24.9 pg — ABNORMAL LOW (ref 27.0–33.0)
MCHC: 30.3 g/dL — ABNORMAL LOW (ref 32.0–36.0)
MCV: 81.9 fL (ref 80.0–100.0)
MPV: 14.3 fL — ABNORMAL HIGH (ref 7.5–12.5)
Monocytes Relative: 8.3 %
Neutro Abs: 5041 cells/uL (ref 1500–7800)
Neutrophils Relative %: 54.2 %
Platelets: 732 10*3/uL — ABNORMAL HIGH (ref 140–400)
RBC: 3.54 10*6/uL — ABNORMAL LOW (ref 3.80–5.10)
RDW: 16.8 % — ABNORMAL HIGH (ref 11.0–15.0)
Total Lymphocyte: 36.9 %
WBC: 9.3 10*3/uL (ref 3.8–10.8)

## 2021-03-30 NOTE — Progress Notes (Signed)
Subjective:  Chief complaint follow-up for bloodstream infection  Patient ID: Amy Murray, female    DOB: 03-Jul-1991, 30 y.o.   MRN: [id number]  HPI  Amy Murray is a 30 year old black woman who has PTSD ADHD and bipolar disorder who had presented with nausea vomiting abdominal pain fevers and seen in the ER thought to have a gastroenteritis then had worsening of pain in her wrist ultimately was admitted and found to have graft group A streptococcus bacteremia.  She had pain in multiple joints including her shoulder where limited range of motion was limited as well as her elbow and wrist and right foot.  We performed an MRI of the shoulder which failed to show evidence of a septic joint but there was question of possible myositis.  While in the hospital and on antibiotics her symptoms improved but did not completely resolve she also underwent transesophageal echocardiogram which was negative for endocarditis.  We were able to discharge her on Zyvox with also plan of moving over to [facility].  Discharge in the hospital she feels much much better and has nearly returned to normal she says range of motion her shoulder is not completely back to normal and she shows me this but she is nontender in the shoulder or the elbow or the wrist or the foot.  She has not been taking the Zyvox correctly only taking it once a day rather than twice a day as it should be prescribed.  We also discussed the options of preexposure prophylaxis also discussed the fact that she was hep C antibody positive but that her RNA was negative.  She brought paperwork allowing her to return to work which I filled out.    Past Medical History:  Diagnosis Date  . Sepsis due to Streptococcus, group A (HCC) 03/30/2021    Past Surgical History:  Procedure Laterality Date  . TEE WITHOUT CARDIOVERSION N/A 03/21/2021   Procedure: TRANSESOPHAGEAL ECHOCARDIOGRAM (TEE);  Surgeon: Elease Hashimoto Deloris Ping, MD;  Location: [facility] ENDOSCOPY;   Service: Cardiovascular;  Laterality: N/A;    No family history on file.    Social History   Socioeconomic History  . Marital status: Single    Spouse name: Not on file  . Number of children: Not on file  . Years of education: Not on file  . Highest education level: Not on file  Occupational History  . Not on file  Tobacco Use  . Smoking status: Never Smoker  . Smokeless tobacco: Never Used  Substance and Sexual Activity  . Alcohol use: Not Currently  . Drug use: Not Currently  . Sexual activity: Not on file  Other Topics Concern  . Not on file  Social History Narrative  . Not on file   Social Determinants of Health   Financial Resource Strain: Not on file  Food Insecurity: Not on file  Transportation Needs: Not on file  Physical Activity: Not on file  Stress: Not on file  Social Connections: Not on file    No Known Allergies   Current Outpatient Medications:  .  divalproex (DEPAKOTE ER) 500 MG 24 hr tablet, Take 1 tablet (500 mg total) by mouth 2 (two) times daily., Disp: 60 tablet, Rfl: 0 .  [START ON 04/02/2021] linezolid (ZYVOX) 600 MG tablet, Take 1 tablet (600 mg total) by mouth 2 (two) times daily for 12 days. Begin taking on 04/02/2021, Disp: 24 tablet, Rfl: 0 .  naproxen (NAPROSYN) 500 MG tablet, Take 1 tablet (500 mg total) by mouth 2 (  two) times daily with a meal., Disp: 60 tablet, Rfl: 0 .  Tedizolid Phosphate 200 MG TABS, Take 200 mg by mouth daily for 6 days., Disp: 6 tablet, Rfl: 0 .  Tedizolid Phosphate 200 MG TABS, Take 200 mg by mouth daily., Disp: 6 tablet, Rfl: 0 .  traZODone (DESYREL) 100 MG tablet, Take 1 tablet (100 mg total) by mouth at bedtime as needed for sleep., Disp: 30 tablet, Rfl: 0   Review of Systems  Constitutional: Negative for activity change, appetite change, chills, diaphoresis, fatigue, fever and unexpected weight change.  HENT: Negative for congestion, rhinorrhea, sinus pressure, sneezing, sore throat and trouble swallowing.    Eyes: Negative for photophobia and visual disturbance.  Respiratory: Negative for cough, chest tightness, shortness of breath, wheezing and stridor.   Cardiovascular: Negative for chest pain, palpitations and leg swelling.  Gastrointestinal: Negative for abdominal distention, abdominal pain, anal bleeding, blood in stool, constipation, diarrhea, nausea and vomiting.  Genitourinary: Negative for difficulty urinating, dysuria, flank pain and hematuria.  Musculoskeletal: Negative for arthralgias, back pain, gait problem, joint swelling and myalgias.  Skin: Negative for color change, pallor, rash and wound.  Neurological: Negative for dizziness, tremors, weakness and light-headedness.  Hematological: Negative for adenopathy. Does not bruise/bleed easily.  Psychiatric/Behavioral: Negative for agitation, behavioral problems, confusion, decreased concentration, dysphoric mood and sleep disturbance.       Objective:   Physical Exam Constitutional:      General: She is not in acute distress.    Appearance: She is not diaphoretic.  HENT:     Head: Normocephalic and atraumatic.     Right Ear: External ear normal.     Left Ear: External ear normal.     Nose: Nose normal.     Mouth/Throat:     Pharynx: No oropharyngeal exudate.  Eyes:     General: No scleral icterus.    Conjunctiva/sclera: Conjunctivae normal.     Pupils: Pupils are equal, round, and reactive to light.  Cardiovascular:     Rate and Rhythm: Normal rate and regular rhythm.     Heart sounds: Normal heart sounds. No murmur heard. No friction rub. No gallop.   Pulmonary:     Effort: Pulmonary effort is normal. No respiratory distress.     Breath sounds: Normal breath sounds. No wheezing or rales.  Abdominal:     General: Bowel sounds are normal. There is no distension.     Palpations: Abdomen is soft.     Tenderness: There is no abdominal tenderness. There is no rebound.  Musculoskeletal:        General: No tenderness.      Right shoulder: No tenderness, bony tenderness or crepitus. Decreased range of motion. Normal strength.     Right elbow: Effusion present. No swelling, deformity or lacerations. Normal range of motion. No tenderness.     Cervical back: Normal range of motion and neck supple.     Right foot: No swelling, deformity, bunion, Charcot foot or foot drop.  Lymphadenopathy:     Cervical: No cervical adenopathy.  Skin:    General: Skin is warm and dry.     Coloration: Skin is not pale.     Findings: No erythema or rash.  Neurological:     General: No focal deficit present.     Mental Status: She is alert and oriented to person, place, and time.     Coordination: Coordination normal.  Psychiatric:        Mood and Affect: Mood normal.  Thought Content: Thought content normal.        Judgment: Judgment normal.           Assessment & Plan:  Group A streptococcus bacteremia: No clear cause was identified.  She did have multiple joints that were hurting but I suspect this was more of a reactive inflammatory state clear she does not have septic joints.  We will have her complete her Zyvox followed by Tedezolid.  CBC with differential today we also check blood cultures 2 weeks after she is completed her antibiotics.  Counseling for preexposure prophylaxis: risk factors include her Ethnicity and fact that she does have sex not just with women but men. She is insistent that she is very careful with regards to her sex life and that she insists on condom use and with partners who is status is unknown and that she insists on people being tested for HIV.  She points out that she has an uncle has HIV and she is quite aware of risk factors for it.  She is not interested in prep at this time.  Disability paperwork returned to work: She does not appear to have any disability or anything that would restrict her from being able to return to her work.  I filled out forms for this.  Hepatitis C  seropositivity RNA was negative so she must to clear this infection.  Counseled her could have been from sex or tattoo.  Again she adamantly denies ever using intravenous drugs.  PTSD and bipolar disorder: Can follow-up with her PCP and psychiatry  I spent greater than 25 minutes with the patient including greater than 50% of time in face to face counsel of the patient and regarding the above problems and filling out her paperwork and with coordination of her care.Marland Kitchen

## 2021-04-06 ENCOUNTER — Telehealth: Payer: Self-pay | Admitting: *Deleted

## 2021-04-06 ENCOUNTER — Telehealth: Payer: Self-pay

## 2021-04-06 ENCOUNTER — Other Ambulatory Visit: Payer: Self-pay | Admitting: Infectious Disease

## 2021-04-06 NOTE — Telephone Encounter (Signed)
FMLA paperwork received. Copy made for triage, original placed in Dr Zenaida Niece Dam's box. Andree Coss, RN

## 2021-04-06 NOTE — Telephone Encounter (Signed)
Cece said she would reach out to the patient for more information.

## 2021-04-06 NOTE — Telephone Encounter (Signed)
Patient called office to follow up on forms that MD signed during last visit.  States that [employer] is requiring more information and was faxed to office.  MD was able to fill out forms to the best of his ability. Faxed to [employer] today. Patient will come to office Monday to pick up copy of forms.  [phone]

## 2021-04-06 NOTE — Telephone Encounter (Signed)
I thought I already filled this out for her

## 2021-04-09 NOTE — Telephone Encounter (Signed)
I filled out a new form as well minus psych stuff

## 2021-04-11 NOTE — Telephone Encounter (Signed)
Patient called stating [employer] was requesting additional information that was not included in previous note. Required "return to work date" that was not included and notes from hospitalization that showed patient was septic. Per Dr. Daiva Eves, patient ok to return to work on 4/1; form was updated with this information. Hospital notes were included as well as labs (cultures showing infection) and imaging results. Sent note with fax stating if actual imaging or further information was needed that they would have to contact Medical Records.   Copies made for patient chart + patient + in triage. Patient notified that she can pick up copy for her records.   Rhiley Solem Loyola Mast, RN

## 2021-04-25 ENCOUNTER — Inpatient Hospital Stay: Payer: Self-pay | Admitting: Physician Assistant

## 2021-05-15 ENCOUNTER — Ambulatory Visit: Payer: Self-pay | Admitting: Infectious Disease

## 2022-10-10 IMAGING — MR MR SHOULDER*R* WO/W CM
8 series · 38 of 40 positions shown · IV contrast (gadavist)
Comparison: None.

CLINICAL DATA: Group a Streptococcus bacteremia. Shoulder pain.
Evaluate for possible septic arthritis.

EXAM:
MRI OF THE RIGHT SHOULDER WITHOUT AND WITH CONTRAST
TECHNIQUE: Multiplanar, multisequence MR imaging of the right shoulder was
performed before and after the administration of intravenous
contrast.
CONTRAST:  7mL GADAVIST GADOBUTROL 1 MMOL/ML IV SOLN

[Series 5: T2 fat-sat · axial · right · 4.0mm · 0.36mm/px · z∈[-119,-28]mm · 5 of 20 slices shown (1 of 3)]
[im 1/20]
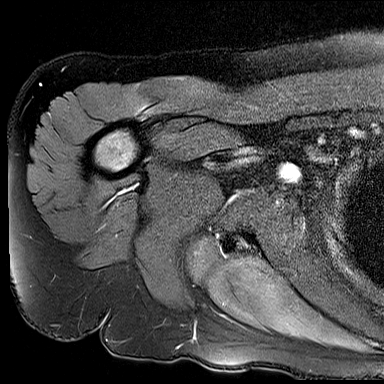
[im 5/20]
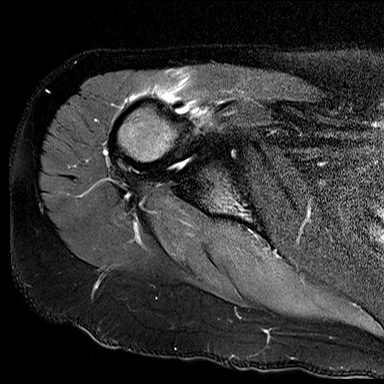
[im 10/20]
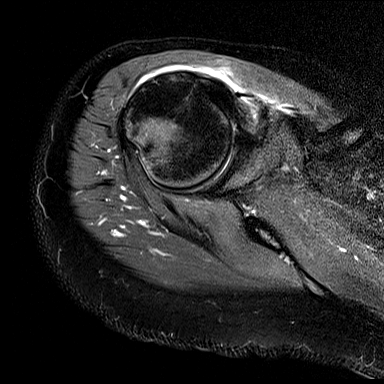
[im 15/20]
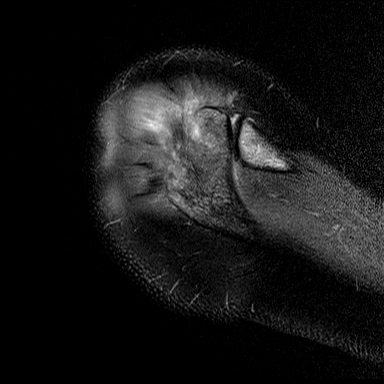
[im 20/20]
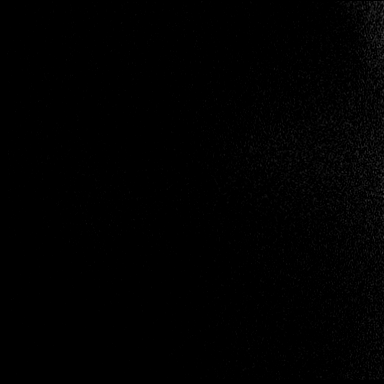

[Series 6: T2 fat-sat · oblique · right · 4.0mm · 0.44mm/px · 5 of 22 slices shown (2 of 3)]
[im 1/22]
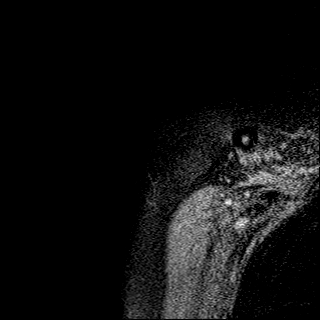
[im 6/22]
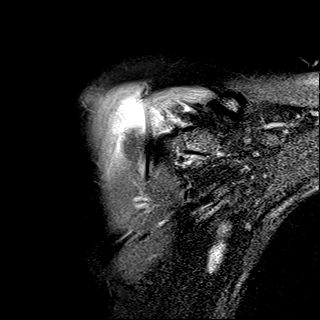
[im 11/22]
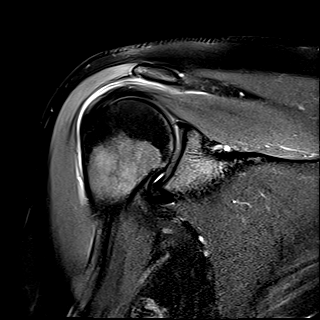
[im 16/22]
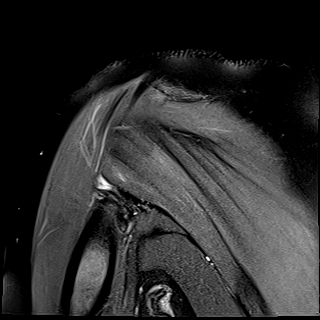
[im 22/22]
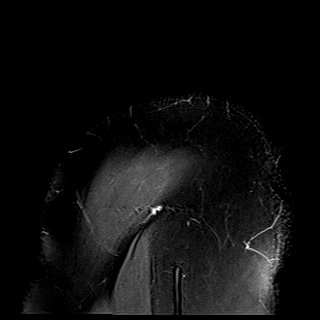

[Series 7: PD fat-sat · oblique · right · 4.0mm · 0.39mm/px · 5 of 22 slices shown]
[im 1/22]
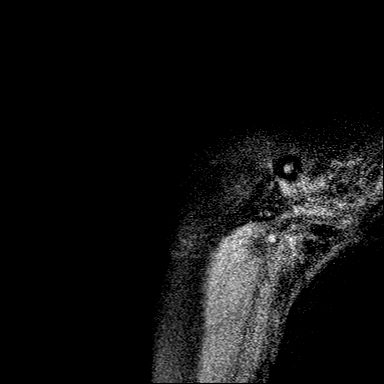
[im 6/22]
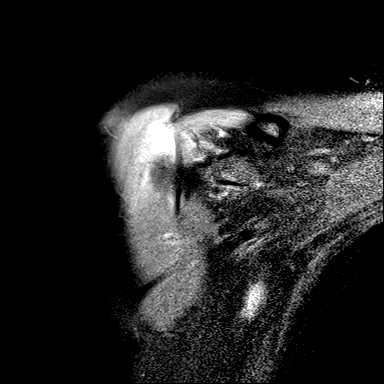
[im 11/22]
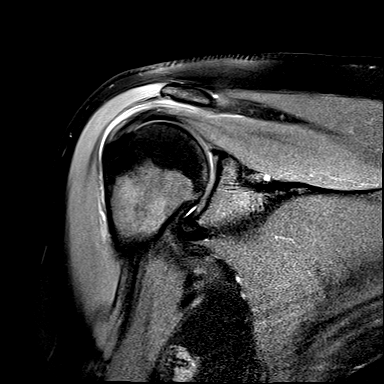
[im 16/22]
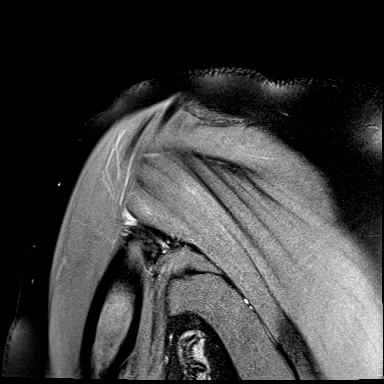
[im 22/22]
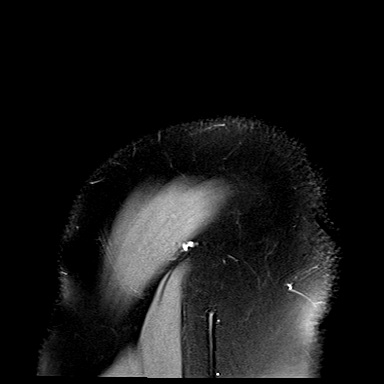

[Series 8: T2 fat-sat · oblique · right · 4.0mm · 0.44mm/px · 5 of 21 slices shown (3 of 3)]
[im 1/21]
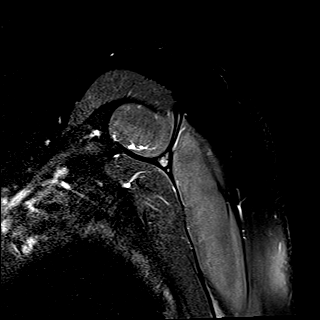
[im 6/21]
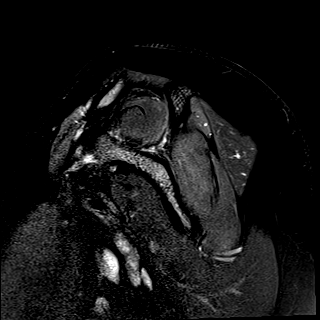
[im 11/21]
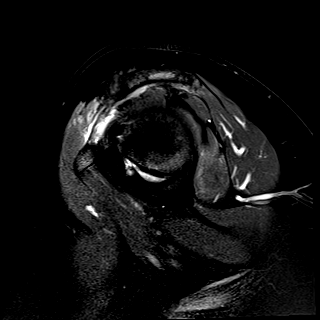
[im 16/21]
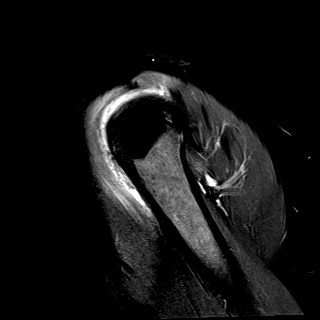
[im 21/21]
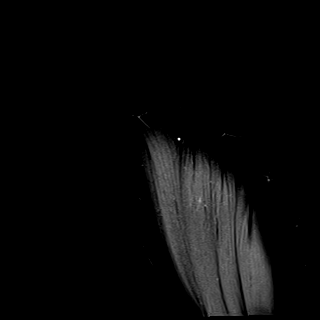

[Series 9: T1 · oblique · right · 4.0mm · 0.36mm/px · 5 of 21 slices shown]
[im 1/21]
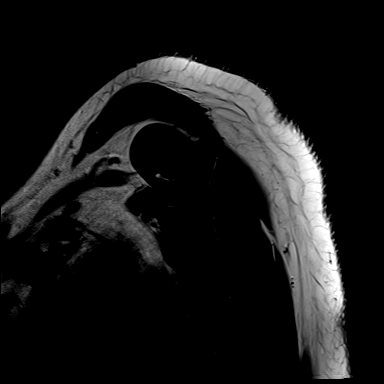
[im 6/21]
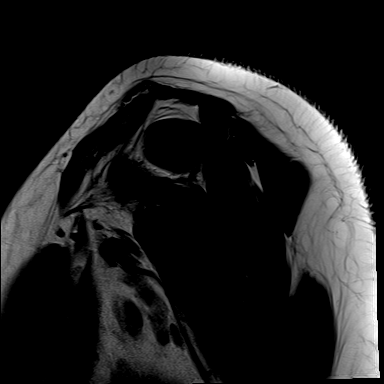
[im 11/21]
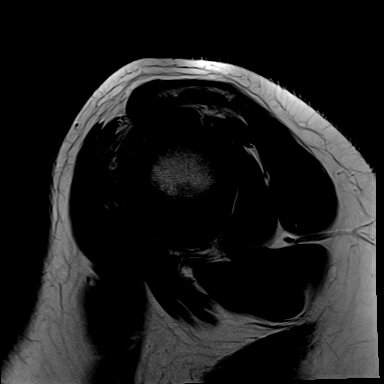
[im 16/21]
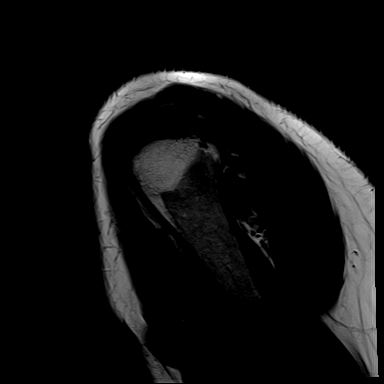
[im 21/21]
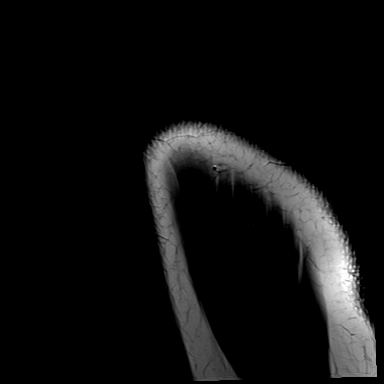

[Series 10: T1 fat-sat · axial · non-contrast · right · 4.0mm · 0.55mm/px · z∈[-129,-28]mm · 5 of 22 slices shown]
[im 1/22]
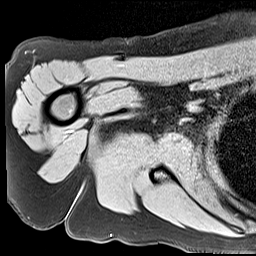
[im 6/22]
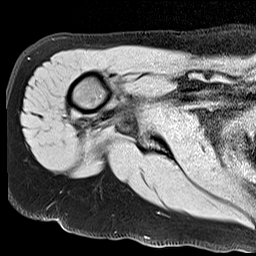
[im 11/22]
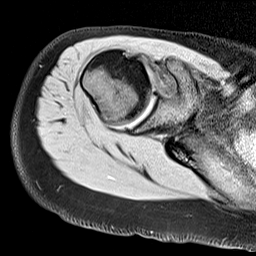
[im 16/22]
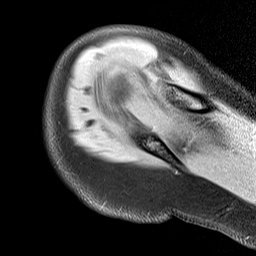
[im 22/22]
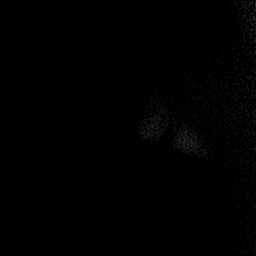

[Series 11: T1 fat-sat post-contrast · axial · right · 4.0mm · 0.55mm/px · z∈[-129,-28]mm · 5 of 22 slices shown (1 of 2)]
[im 1/22]
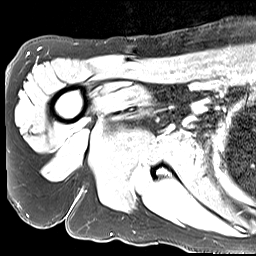
[im 6/22]
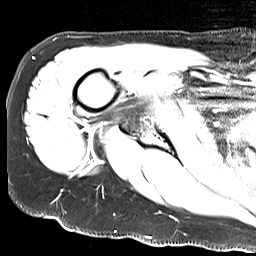
[im 11/22]
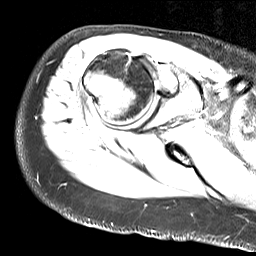
[im 16/22]
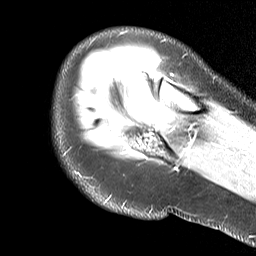
[im 22/22]
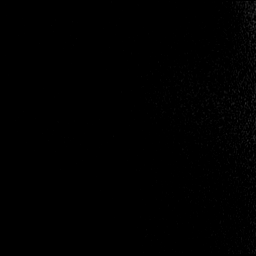

[Series 12: T1 fat-sat post-contrast · oblique · right · 4.0mm · 0.27mm/px · 3 of 22 slices shown (2 of 2)]
[im 1/22]
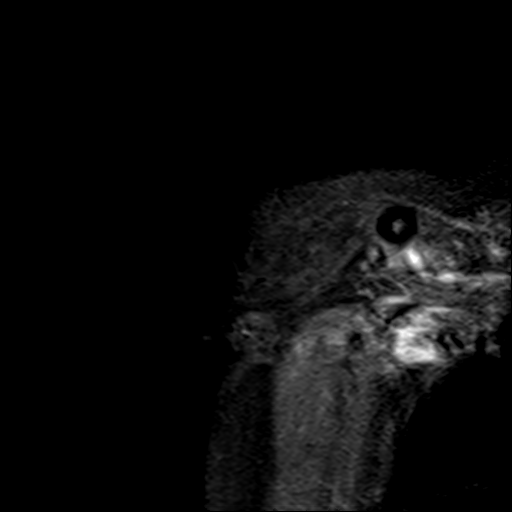
[im 6/22]
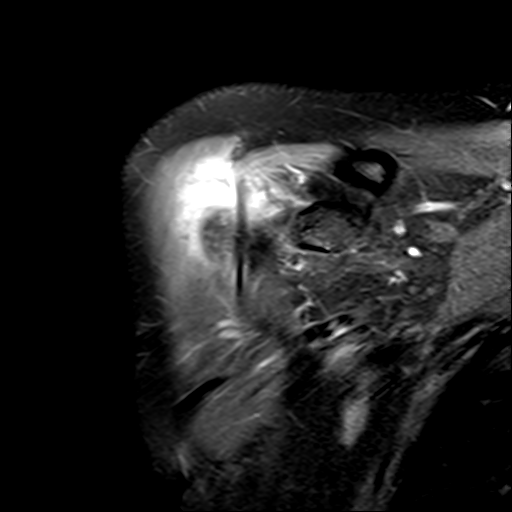
[im 11/22]
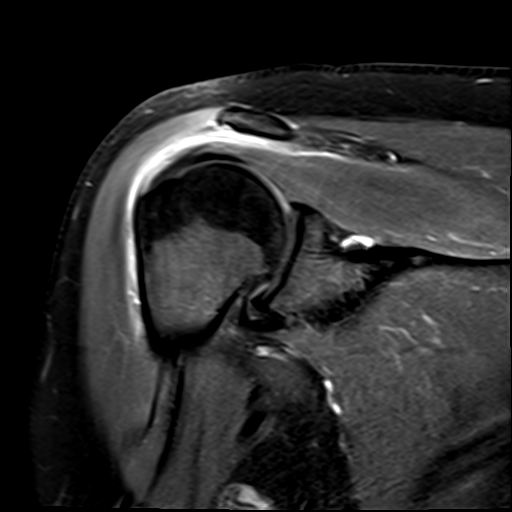

[38 of 40 positions shown; findings below may reference images not displayed]

FINDINGS: Rotator cuff: Minimal rotator cuff tendinopathy/tendinosis. Mild
bursal surface fraying and fibrillation. No rotator cuff tear.

Muscles: Mild edema like signal abnormality mainly involving the
infraspinatus muscle but also to a lesser extent the supraspinatus
muscle. The other shoulder musculature appears normal. Findings
could be due to a nontraumatic neurogenic cause such as [place]
Turner syndrome. No muscle mass or abnormal muscle enhancement. No
findings for pyomyositis.

Biceps long head:  Intact

Acromioclavicular Joint:  Normal.  No findings for septic arthritis.

Glenohumeral Joint: Normal articular cartilage. No joint effusion.
No findings for septic arthritis.

Labrum:  Intact

Bones:  No findings suspicious for osteomyelitis.

Other: Moderate subacromial/subdeltoid bursitis with diffuse
inflammation/enhancement but I do not see any definite rim enhancing
fluid collection to suggest septic bursitis. If symptoms persist or
worsen bursal aspiration may be indicated.
IMPRESSION: 1. Moderate subacromial/subdeltoid bursitis with diffuse
inflammation/enhancement but I do not see any definite rim enhancing
fluid collection to suggest septic bursitis.
2. Minimal rotator cuff tendinopathy/tendinosis. No rotator cuff
tear.
3. No findings for septic arthritis or osteomyelitis involving the
AC joint or glenohumeral joint.
4. Mild edema like signal abnormality in the infraspinatus muscle
but also to a lesser extent the supraspinatus muscle. Findings could
be due to a nontraumatic neurogenic cause such as [place] Turner
syndrome.
5. Intact long head biceps tendon and glenoid labrum.
